# Patient Record
Sex: Female | Born: 1985 | Race: Black or African American | Hispanic: No | Marital: Married | Smoking: Never smoker
Health system: Southern US, Community
[De-identification: ages and names within clinical notes are randomized; demographics above are authoritative.]

## PROBLEM LIST (undated history)

## (undated) ENCOUNTER — Emergency Department (HOSPITAL_COMMUNITY): Discharge status: Against Medical Advice | Payer: Managed Care, Other (non HMO)

## (undated) ENCOUNTER — Inpatient Hospital Stay (HOSPITAL_COMMUNITY): Payer: Self-pay

## (undated) DIAGNOSIS — R87619 Unspecified abnormal cytological findings in specimens from cervix uteri: Secondary | ICD-10-CM

## (undated) DIAGNOSIS — O364XX0 Maternal care for intrauterine death, not applicable or unspecified: Secondary | ICD-10-CM

## (undated) HISTORY — PX: OTHER SURGICAL HISTORY: SHX169

## (undated) HISTORY — DX: Unspecified abnormal cytological findings in specimens from cervix uteri: R87.619

---

## 2010-05-16 ENCOUNTER — Other Ambulatory Visit: Payer: Self-pay

## 2010-05-16 DIAGNOSIS — Z3689 Encounter for other specified antenatal screening: Secondary | ICD-10-CM

## 2010-05-19 ENCOUNTER — Ambulatory Visit (HOSPITAL_COMMUNITY)
Admission: RE | Admit: 2010-05-19 | Discharge: 2010-05-19 | Disposition: A | Discharge status: Home / Self Care | Payer: Self-pay | Source: Ambulatory Visit | Attending: Family Medicine | Admitting: Family Medicine

## 2010-05-19 ENCOUNTER — Encounter (HOSPITAL_COMMUNITY): Payer: Self-pay

## 2010-05-19 DIAGNOSIS — O0930 Supervision of pregnancy with insufficient antenatal care, unspecified trimester: Secondary | ICD-10-CM | POA: Insufficient documentation

## 2010-05-19 DIAGNOSIS — Z3689 Encounter for other specified antenatal screening: Secondary | ICD-10-CM | POA: Insufficient documentation

## 2010-06-19 ENCOUNTER — Inpatient Hospital Stay (HOSPITAL_COMMUNITY): Admit: 2010-06-19 | Payer: Self-pay | Admitting: Obstetrics & Gynecology

## 2011-01-05 ENCOUNTER — Encounter: Payer: Self-pay | Admitting: Obstetrics & Gynecology

## 2011-01-05 ENCOUNTER — Ambulatory Visit (INDEPENDENT_AMBULATORY_CARE_PROVIDER_SITE_OTHER): Payer: Managed Care, Other (non HMO) | Admitting: Obstetrics & Gynecology

## 2011-01-05 ENCOUNTER — Other Ambulatory Visit (HOSPITAL_COMMUNITY)
Admission: RE | Admit: 2011-01-05 | Discharge: 2011-01-05 | Disposition: A | Discharge status: Home / Self Care | Payer: Managed Care, Other (non HMO) | Source: Ambulatory Visit | Attending: Obstetrics & Gynecology | Admitting: Obstetrics & Gynecology

## 2011-01-05 VITALS — BP 111/65 | HR 75 | Temp 98.1°F | Ht 65.0 in | Wt 136.1 lb

## 2011-01-05 DIAGNOSIS — R87629 Unspecified abnormal cytological findings in specimens from vagina: Secondary | ICD-10-CM

## 2011-01-05 DIAGNOSIS — IMO0002 Reserved for concepts with insufficient information to code with codable children: Secondary | ICD-10-CM

## 2011-01-05 DIAGNOSIS — R87619 Unspecified abnormal cytological findings in specimens from cervix uteri: Secondary | ICD-10-CM | POA: Insufficient documentation

## 2011-01-05 NOTE — Progress Notes (Signed)
  Subjective:    Patient ID: Abigail [name], female    DOB: 1985-09-15, 25 y.o.   MRN: [id number]  HPI  Ms. 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  Cervical cultures were negative.  Review of Systems     Objective:   Physical Exam colpo done, ECC obtained. Colpo reveals normal cervix.       Assessment & Plan:  H/o ASCUS pap with HR HPV I repeated her pap today and did a colpo.  Unless her ECC is positive, she should get another pap in 6 months.

## 2012-03-20 NOTE — L&D Delivery Note (Signed)
Delivery Note At 9:05 AM a viable female was delivered via Vaginal, Spontaneous Delivery (Presentation: LOA).  APGAR: 9, 9; weight 7 lb 0.2 oz (3180 g).   Placenta status: Intact, Spontaneous.  Cord: 3 vessels with the following complications: None.  Anesthesia: None  Episiotomy: None Lacerations: None Suture Repair: n/a Est. Blood Loss (mL): 150  Mom to postpartum.  Baby to nursery-stable. Placenta to L&D  Was called because patient was directly admitted from MAU and required IV pain medication. Received a second call before order was placed because cervix was dilated to 10cm with membranes intact. Membranes were artificially ruptured with light meconium stained fluid evacuated. Delivery was uncomplicated and baby delivered with vigorous cry and placed on mother for skin to skin. Placenta was delivered with no complications. Mild perineal abrasion on inspection; hemostatic.  Jacquelin Hawking 12/05/2012, 11:17 AM  I was present for and assisted with the delivery of this newborn.  I supervised Dr. Mal Misty throughout the delivery and agree with the above documentation.   Rulon Abide, M.D. [facility] Fellow 12/05/2012 11:48 AM

## 2012-06-01 LAB — OB RESULTS CONSOLE GC/CHLAMYDIA
Chlamydia: NEGATIVE
Gonorrhea: NEGATIVE

## 2012-06-01 LAB — OB RESULTS CONSOLE RUBELLA ANTIBODY, IGM: Rubella: IMMUNE

## 2012-06-01 LAB — OB RESULTS CONSOLE ANTIBODY SCREEN: Antibody Screen: NEGATIVE

## 2012-06-01 LAB — OB RESULTS CONSOLE GBS: GBS: NEGATIVE

## 2012-10-04 ENCOUNTER — Inpatient Hospital Stay (HOSPITAL_COMMUNITY)
Admission: AD | Admit: 2012-10-04 | Discharge: 2012-10-04 | Disposition: A | Discharge status: Home / Self Care | Payer: Medicaid Other | Source: Ambulatory Visit | Attending: Obstetrics & Gynecology | Admitting: Obstetrics & Gynecology

## 2012-10-04 ENCOUNTER — Other Ambulatory Visit: Payer: Self-pay | Admitting: Advanced Practice Midwife

## 2012-10-04 ENCOUNTER — Inpatient Hospital Stay (HOSPITAL_COMMUNITY): Payer: Medicaid Other

## 2012-10-04 ENCOUNTER — Encounter (HOSPITAL_COMMUNITY): Payer: Self-pay | Admitting: *Deleted

## 2012-10-04 DIAGNOSIS — O4700 False labor before 37 completed weeks of gestation, unspecified trimester: Secondary | ICD-10-CM | POA: Insufficient documentation

## 2012-10-04 DIAGNOSIS — R109 Unspecified abdominal pain: Secondary | ICD-10-CM | POA: Insufficient documentation

## 2012-10-04 LAB — CBC
Hemoglobin: 11.3 g/dL — ABNORMAL LOW (ref 12.0–15.0)
MCH: 29.1 pg (ref 26.0–34.0)
MCV: 86.1 fL (ref 78.0–100.0)
Platelets: 232 10*3/uL (ref 150–400)
RBC: 3.88 MIL/uL (ref 3.87–5.11)
WBC: 13.7 10*3/uL — ABNORMAL HIGH (ref 4.0–10.5)

## 2012-10-04 LAB — URINALYSIS, ROUTINE W REFLEX MICROSCOPIC
Bilirubin Urine: NEGATIVE
Specific Gravity, Urine: 1.015 (ref 1.005–1.030)
pH: 7.5 (ref 5.0–8.0)

## 2012-10-04 LAB — FETAL FIBRONECTIN: Fetal Fibronectin: NEGATIVE

## 2012-10-04 LAB — URINE MICROSCOPIC-ADD ON

## 2012-10-04 MED ORDER — OXYCODONE-ACETAMINOPHEN 5-325 MG PO TABS: 1.0000 | ORAL_TABLET | Freq: Four times a day (QID) | ORAL | Status: DC | PRN

## 2012-10-04 MED ORDER — HYDROMORPHONE HCL PF 1 MG/ML IJ SOLN
1.0000 mg | INTRAMUSCULAR | Status: AC
  Administered 2012-10-04: 1 mg via INTRAVENOUS
  Filled 2012-10-04: qty 1

## 2012-10-04 MED ORDER — NIFEDIPINE 10 MG PO CAPS
10.0000 mg | ORAL_CAPSULE | ORAL | Status: DC | PRN
  Administered 2012-10-04: 10 mg via ORAL
  Filled 2012-10-04: qty 1

## 2012-10-04 MED ORDER — LACTATED RINGERS IV BOLUS (SEPSIS)
1000.0000 mL | Freq: Once | INTRAVENOUS | Status: AC
  Administered 2012-10-04: 1000 mL via INTRAVENOUS

## 2012-10-04 MED ORDER — FENTANYL CITRATE 0.05 MG/ML IJ SOLN
50.0000 ug | Freq: Once | INTRAMUSCULAR | Status: AC
  Administered 2012-10-04: 50 ug via INTRAVENOUS
  Filled 2012-10-04: qty 2

## 2012-10-04 NOTE — MAU Provider Note (Signed)
Chief Complaint:  Labor Eval    First Provider Initiated Contact with Patient 10/04/12 [phone]     HPI: Abigail [name] is a 27 y.o. G2P1010 at 30.6 weeks by LMP who presents to maternity admissions reporting possible contractions w/ pain in her left mid- back radiating downward. Prenatal care in [place], but moved to [place] three weeks ago. Pos FM.   Pregnancy Course: Uncomplicated per pt.    Past Medical History: Past Medical History  Diagnosis Date  . Abnormal Pap smear of cervix     Past obstetric history: OB History   Grav Para Term Preterm Abortions TAB SAB Ect Mult Living   3 1 1  1 1    1      # Outc Date GA Lbr Len/2nd Wgt Sex Del Anes PTL Lv   1 TAB 2006           2 TRM 3/12 [redacted]w[redacted]d   M SVD   Yes   Comments: born at [facility]   3 CUR               Past Surgical History: Past Surgical History  Procedure Laterality Date  . Colonoscopy      Family History: Family History  Problem Relation Age of Onset  . Diabetes Mother   . Hypertension Mother   . Diabetes Father   . Hypertension Father     Social History: History  Substance Use Topics  . Smoking status: Never Smoker   . Smokeless tobacco: Never Used  . Alcohol Use: No    Allergies: No Known Allergies  Meds:  Prescriptions prior to admission  Medication Sig Dispense Refill  . prenatal vitamin w/FE, FA (PRENATAL 1 + 1) 27-1 MG TABS Take 1 tablet by mouth daily.          ROS: Pos for left flank and low back pain, possible contractions. Neg for fever, chills, N/V/D/C, urinary complaints, GI complaints, LOF, VB  Physical Exam  Blood pressure 126/80, pulse 106, temperature 98.1 F (36.7 C), temperature source Oral, resp. rate 18. GENERAL: Well-developed, well-nourished female in severe distress. Unable to tolerate lying down. HEENT: normocephalic HEART: Tachycardic RESP: normal effort ABDOMEN: Soft, non-tender, gravid appropriate for gestational age. BACK: Pos left CVAT. EXTREMITIES: Nontender, no  edema NEURO: alert and oriented PELVIC EXAM: NEFG, physiologic discharge, no blood, cervix clean Cervix 0/50/-2, mid-position, firm, vtx  FHT:  Baseline 140 , moderate variability, accelerations present, no decelerations Contractions: irritability q 4-6 mins, mild   Labs: Results for orders placed during the hospital encounter of 10/04/12 (from the past 24 hour(s))  CBC     Status: Abnormal   Collection Time    10/04/12  7:45 AM      Result Value Range   WBC 13.7 (*) 4.0 - 10.5 K/uL   RBC 3.88  3.87 - 5.11 MIL/uL   Hemoglobin 11.3 (*) 12.0 - 15.0 g/dL   HCT 96.0 (*) 45.4 - 09.8 %   MCV 86.1  78.0 - 100.0 fL   MCH 29.1  26.0 - 34.0 pg   MCHC 33.8  30.0 - 36.0 g/dL   RDW 11.9  14.7 - 82.9 %   Platelets 232  150 - 400 K/uL    MAU Course: UA, Renal US, Fentanyl, CB, fFN, IV bolus, Procardia ordered. Records requested.  Care of pt turned over to Joseph Berkshire, [place] at 0800.  [place], [place] 10/04/2012 8:01 AM  Imaging:   US Transvaginal Non-ob  10/04/2012   *RADIOLOGY  REPORT*  Clinical Data: Left flank pain.  30 weeks estimated gestational age.  Hematuria  RENAL/URINARY TRACT ULTRASOUND COMPLETE  Comparison:  None.  Findings:  Right Kidney:  Demonstrates a sagittal length of 10.7 cm.  No focal parenchymal abnormalities or signs of hydronephrosis are evident  Left Kidney:  Demonstrates a sagittal length of 11.5 cm.  There is mild dilatation of the renal pelvis and calyceal system noted. Associated dilatation of the proximal aspect of the ureter was seen.  At real time exam this appeared to taper in the region of the mid abdomen.  No focal parenchymal abnormalities or signs of renal calculi are noted.  Bladder:  Particulate matter was noted within the bladder lumen. No evidence for distal ureteral dilatation or distal renal stone was seen with endovaginal evaluation.  Both ureteral jets were noted.  IMPRESSION: Mild left renal pyelectasis with associated mild proximal ureteral  dilatation.  No evidence for a renal or ureteral calculus was seen.  Echoes within the bladder lumen would correlate with proteinaceous material within the urine, likely related to intraluminal blood given the provided history of hematuria   Original Report Authenticated By: Rhodia Albright, M.D.   US Renal  10/04/2012   *RADIOLOGY REPORT*  Clinical Data: Left flank pain.  30 weeks estimated gestational age.  Hematuria  RENAL/URINARY TRACT ULTRASOUND COMPLETE  Comparison:  None.  Findings:  Right Kidney:  Demonstrates a sagittal length of 10.7 cm.  No focal parenchymal abnormalities or signs of hydronephrosis are evident  Left Kidney:  Demonstrates a sagittal length of 11.5 cm.  There is mild dilatation of the renal pelvis and calyceal system noted. Associated dilatation of the proximal aspect of the ureter was seen.  At real time exam this appeared to taper in the region of the mid abdomen.  No focal parenchymal abnormalities or signs of renal calculi are noted.  Bladder:  Particulate matter was noted within the bladder lumen. No evidence for distal ureteral dilatation or distal renal stone was seen with endovaginal evaluation.  Both ureteral jets were noted.  IMPRESSION: Mild left renal pyelectasis with associated mild proximal ureteral dilatation.  No evidence for a renal or ureteral calculus was seen.  Echoes within the bladder lumen would correlate with proteinaceous material within the urine, likely related to intraluminal blood given the provided history of hematuria   Original Report Authenticated By: Rhodia Albright, M.D.   Dilaudid 1 mg IV x1 dose in MAU Per Dr Debroah Loop, D/C home with PTL precautions Percocet 5/325 x30 tabs Strain urine F/u with prenatal provider Return to MAU as needed  Sharen Counter Certified Nurse-Midwife

## 2012-10-04 NOTE — Progress Notes (Signed)
Pt returned to MAU reporting she got to pharmacy and did not have her prescription.  Called pt pharmacy and no prescription filled today.  Unable to reprint Percocet prescription despite several attempts so Percocet 5/325 x30 tabs written for pt.

## 2012-10-04 NOTE — Progress Notes (Signed)
Pt reports she got to pharmacy and did not have paper prescription with her.  Called pt listed pharmacy and no medication was filled today.  New prescription printed for Percocet 30 tabs.

## 2012-10-04 NOTE — MAU Note (Signed)
Pt presents with complaints of contractions that started around 455 this am. Denies any leakage of fluid or bleeding. States baby is active.

## 2012-10-07 NOTE — MAU Provider Note (Signed)
Agree with note. 

## 2012-12-05 ENCOUNTER — Inpatient Hospital Stay (HOSPITAL_COMMUNITY): Admit: 2012-12-05 | Payer: Self-pay | Admitting: Obstetrics & Gynecology

## 2012-12-05 ENCOUNTER — Inpatient Hospital Stay (HOSPITAL_COMMUNITY)
Admission: AD | Admit: 2012-12-05 | Discharge: 2012-12-06 | DRG: 775 | Disposition: A | Discharge status: Home / Self Care | Payer: Medicaid Other | Source: Ambulatory Visit | Attending: Obstetrics & Gynecology | Admitting: Obstetrics & Gynecology

## 2012-12-05 ENCOUNTER — Encounter (HOSPITAL_COMMUNITY): Payer: Self-pay

## 2012-12-05 LAB — CBC
MCV: 85.7 fL (ref 78.0–100.0)
Platelets: 282 10*3/uL (ref 150–400)
RBC: 3.99 MIL/uL (ref 3.87–5.11)
WBC: 11.9 10*3/uL — ABNORMAL HIGH (ref 4.0–10.5)

## 2012-12-05 LAB — ABO/RH: ABO/RH(D): O POS

## 2012-12-05 LAB — RPR: RPR Ser Ql: NONREACTIVE

## 2012-12-05 MED ORDER — PRENATAL MULTIVITAMIN CH
1.0000 | ORAL_TABLET | Freq: Every day | ORAL | Status: DC
  Administered 2012-12-05: 1 via ORAL
  Filled 2012-12-05: qty 1

## 2012-12-05 MED ORDER — IBUPROFEN 600 MG PO TABS
600.0000 mg | ORAL_TABLET | Freq: Four times a day (QID) | ORAL | Status: DC | PRN
  Administered 2012-12-05: 600 mg via ORAL
  Filled 2012-12-05: qty 1

## 2012-12-05 MED ORDER — OXYTOCIN BOLUS FROM INFUSION
500.0000 mL | INTRAVENOUS | Status: DC
  Administered 2012-12-05: 500 mL via INTRAVENOUS

## 2012-12-05 MED ORDER — ONDANSETRON HCL 4 MG/2ML IJ SOLN: 4.0000 mg | Freq: Four times a day (QID) | INTRAMUSCULAR | Status: DC | PRN

## 2012-12-05 MED ORDER — DIBUCAINE 1 % RE OINT
1.0000 "application " | TOPICAL_OINTMENT | RECTAL | Status: DC | PRN
  Administered 2012-12-05: 1 via RECTAL
  Filled 2012-12-05: qty 28

## 2012-12-05 MED ORDER — SIMETHICONE 80 MG PO CHEW: 80.0000 mg | CHEWABLE_TABLET | ORAL | Status: DC | PRN

## 2012-12-05 MED ORDER — CITRIC ACID-SODIUM CITRATE 334-500 MG/5ML PO SOLN: 30.0000 mL | ORAL | Status: DC | PRN

## 2012-12-05 MED ORDER — LACTATED RINGERS IV SOLN: 500.0000 mL | INTRAVENOUS | Status: DC | PRN

## 2012-12-05 MED ORDER — LACTATED RINGERS IV SOLN
INTRAVENOUS | Status: DC
  Administered 2012-12-05: 09:00:00 via INTRAVENOUS

## 2012-12-05 MED ORDER — DIPHENHYDRAMINE HCL 25 MG PO CAPS: 25.0000 mg | ORAL_CAPSULE | Freq: Four times a day (QID) | ORAL | Status: DC | PRN

## 2012-12-05 MED ORDER — WITCH HAZEL-GLYCERIN EX PADS
1.0000 "application " | MEDICATED_PAD | CUTANEOUS | Status: DC | PRN
  Administered 2012-12-05: 1 via TOPICAL

## 2012-12-05 MED ORDER — OXYCODONE-ACETAMINOPHEN 5-325 MG PO TABS
1.0000 | ORAL_TABLET | ORAL | Status: DC | PRN
  Administered 2012-12-05: 1 via ORAL
  Filled 2012-12-05: qty 1

## 2012-12-05 MED ORDER — TETANUS-DIPHTH-ACELL PERTUSSIS 5-2.5-18.5 LF-MCG/0.5 IM SUSP
0.5000 mL | Freq: Once | INTRAMUSCULAR | Status: AC
  Administered 2012-12-06: 0.5 mL via INTRAMUSCULAR
  Filled 2012-12-05: qty 0.5

## 2012-12-05 MED ORDER — ONDANSETRON HCL 4 MG/2ML IJ SOLN: 4.0000 mg | INTRAMUSCULAR | Status: DC | PRN

## 2012-12-05 MED ORDER — OXYCODONE-ACETAMINOPHEN 5-325 MG PO TABS: 1.0000 | ORAL_TABLET | ORAL | Status: DC | PRN

## 2012-12-05 MED ORDER — OXYTOCIN 40 UNITS IN LACTATED RINGERS INFUSION - SIMPLE MED
62.5000 mL/h | INTRAVENOUS | Status: DC
  Administered 2012-12-05: 62.5 mL/h via INTRAVENOUS
  Filled 2012-12-05: qty 1000

## 2012-12-05 MED ORDER — ONDANSETRON HCL 4 MG PO TABS: 4.0000 mg | ORAL_TABLET | ORAL | Status: DC | PRN

## 2012-12-05 MED ORDER — LIDOCAINE HCL (PF) 1 % IJ SOLN
30.0000 mL | INTRAMUSCULAR | Status: DC | PRN
  Filled 2012-12-05 (×2): qty 30

## 2012-12-05 MED ORDER — SENNOSIDES-DOCUSATE SODIUM 8.6-50 MG PO TABS
2.0000 | ORAL_TABLET | ORAL | Status: DC
  Administered 2012-12-06: 2 via ORAL

## 2012-12-05 MED ORDER — ZOLPIDEM TARTRATE 5 MG PO TABS: 5.0000 mg | ORAL_TABLET | Freq: Every evening | ORAL | Status: DC | PRN

## 2012-12-05 MED ORDER — BENZOCAINE-MENTHOL 20-0.5 % EX AERO
1.0000 "application " | INHALATION_SPRAY | CUTANEOUS | Status: DC | PRN
  Administered 2012-12-05: 1 via TOPICAL
  Filled 2012-12-05: qty 56

## 2012-12-05 MED ORDER — LANOLIN HYDROUS EX OINT: TOPICAL_OINTMENT | CUTANEOUS | Status: DC | PRN

## 2012-12-05 MED ORDER — ACETAMINOPHEN 325 MG PO TABS: 650.0000 mg | ORAL_TABLET | ORAL | Status: DC | PRN

## 2012-12-05 MED ORDER — IBUPROFEN 600 MG PO TABS
600.0000 mg | ORAL_TABLET | Freq: Four times a day (QID) | ORAL | Status: DC
  Administered 2012-12-05 – 2012-12-06 (×3): 600 mg via ORAL
  Filled 2012-12-05 (×3): qty 1

## 2012-12-05 NOTE — MAU Note (Signed)
Contractions since 0330. No bleeding or leaking, 2nd preg. Was 4 cm when last checked. No problems with preg.

## 2012-12-05 NOTE — Lactation Note (Signed)
This note was copied from the chart of Abigail [name]. Lactation Consultation Note  Patient Name: Abigail Fujiko Picazo Today's Date: 12/05/2012 Reason for consult: Initial assessment of this second-time mother and her new baby at 8 hours of age.  Baby is asleep STS and not showing any hunger cues.  RN, Misty Stanley had reported that mom is experiencing some nipple tenderness on (R) breast similar to experience with her first baby.  Mom states baby latches easily on (L).  Baby had fed an hour ago.  Per mom, ahe stopped breastfeeding with her first baby when nipple soreness persisted after one month and she never used comfort gelpads so LC offered and reviewed their use and discussed plan with mom and her nurse.  RN to assist with latch at next feeding and mom to try starting on easier (L), then switch to (R).  LC encouraged continuing STS and cue feedings tonight and  LC provided [facility] Resource brochure and reviewed [facility] services and list of community and web site resources.    Maternal Data Formula Feeding for Exclusion: No Infant to breast within first hour of birth: No Breastfeeding delayed due to:: Other (comment) (not documented) Has patient been taught Hand Expression?: Yes Does the patient have breastfeeding experience prior to this delivery?: Yes  Feeding Feeding Type: Breast Milk  LATCH Score/Interventions Latch: Repeated attempts needed to sustain latch, nipple held in mouth throughout feeding, stimulation needed to elicit sucking reflex. Intervention(s): Assist with latch  Audible Swallowing: A few with stimulation Intervention(s): Skin to skin  Type of Nipple: Everted at rest and after stimulation  Comfort (Breast/Nipple): Filling, red/small blisters or bruises, mild/mod discomfort  Problem noted: Mild/Moderate discomfort (right side, per mom)  Hold (Positioning): Assistance needed to correctly position infant at breast and maintain latch. Intervention(s): Breastfeeding basics  reviewed  LATCH Score: 7  (previous feeding assessment by RN)  Lactation Tools Discussed/Used Tools: Comfort gels STS, cue feedings Switch nursing from easier to more difficult breast  Consult Status Consult Status: Follow-up Date: 12/06/12 Follow-up type: In-patient    Warrick Parisian [facility] 12/05/2012, 9:01 PM

## 2012-12-05 NOTE — H&P (Signed)
Abigail [name] is a 27 y.o. female presenting for contractions since 0330 this morning. She reports painful contractions without LOF or VB.  +FM.   Prenatal care was obtained in [facility] and records are being obtained. Per the pt her [facility] was uncomplicated and she denies having GBS.    History OB History   Grav Para Term Preterm Abortions TAB SAB Ect Mult Living   3 1 1  1 1    1      Past Medical History  Diagnosis Date  . Abnormal Pap smear of cervix    Past Surgical History  Procedure Laterality Date  . Colonoscopy     Family History: family history includes Diabetes in her father and mother; Hypertension in her father and mother. Social History:  reports that she has never smoked. She has never used smokeless tobacco. She reports that she does not drink alcohol or use illicit drugs.   Prenatal Transfer Tool  Maternal Diabetes: No Genetic Screening: Unknown Maternal Ultrasounds/Referrals: Unknown Fetal Ultrasounds or other Referrals:  Unknown Maternal Substance Abuse:  No Significant Maternal Medications:  None Significant Maternal Lab Results:  Unknown Other Comments:  None  Review of Systems  Gastrointestinal: Positive for abdominal pain.       Painful contractions    Dilation: 10 Effacement (%): 100 Station: -1 Exam by:: christine, RN  Blood pressure 92/53, pulse 79, temperature 98.1 F (36.7 C), temperature source Oral, resp. rate 20, last menstrual period 03/02/2012.   Fetal Exam Fetal Monitor Review: Mode: ultrasound.   Baseline rate: 130.  Variability: moderate (6-25 bpm).   Pattern: no accelerations and no decelerations.    Fetal State Assessment: Category I - tracings are normal.     Physical Exam  Nursing note and vitals reviewed. Constitutional: She is oriented to person, place, and time. She appears well-developed and well-nourished. No distress.  Respiratory: No respiratory distress.  Genitourinary: Vagina normal.  Musculoskeletal: Normal  range of motion. She exhibits no edema and no tenderness.  Neurological: She is alert and oriented to person, place, and time.  Skin: Skin is warm and dry. She is not diaphoretic. No erythema.    Prenatal labs: ABO, Rh: O/Positive/-- (03/15 0000) Antibody: Negative (03/15 0000) Rubella: Immune (03/15 0000) RPR: Nonreactive (03/15 0000) HBsAg: Negative (03/15 0000) HIV: Non-reactive (03/15 0000) GBS: Negative (03/15 0000)  Assessment/Plan: 41LK G4W1027 at [redacted]w[redacted]d in active labor  #Active labor - she was directly admitted from MAU and was completely dilated by the time she got to the floor - patient already admitted to birthing suites - routine labor orders - wants pain medication, but already dilated to 10cm - expect NSVD  #Postpartum - Method of contraception: Mirena - Method of feeding: breastfeeding only - Circumcision: Girl; n/a   Jacquelin Hawking 12/05/2012, 9:47 AM

## 2012-12-06 LAB — CBC
HCT: 30.9 % — ABNORMAL LOW (ref 36.0–46.0)
Hemoglobin: 10.2 g/dL — ABNORMAL LOW (ref 12.0–15.0)
MCV: 86.3 fL (ref 78.0–100.0)
RBC: 3.58 MIL/uL — ABNORMAL LOW (ref 3.87–5.11)
WBC: 13.8 10*3/uL — ABNORMAL HIGH (ref 4.0–10.5)

## 2012-12-06 MED ORDER — PRENATAL MULTIVITAMIN CH: 1.0000 | ORAL_TABLET | Freq: Every day | ORAL | Status: DC

## 2012-12-06 MED ORDER — IBUPROFEN 600 MG PO TABS: 600.0000 mg | ORAL_TABLET | Freq: Four times a day (QID) | ORAL | Status: DC

## 2012-12-06 NOTE — Progress Notes (Signed)
Post Partum Day 1 Subjective: no complaints, up ad lib, voiding and tolerating PO  BF well.  Objective: Blood pressure 136/83, pulse 59, temperature 98 F (36.7 C), temperature source Oral, resp. rate 19, height 5\' 6"  (1.676 m), weight 70.761 kg (156 lb), last menstrual period 03/02/2012, SpO2 100.00%, unknown if currently breastfeeding.  Physical Exam:  General: alert, cooperative, appears stated age and no distress Lochia: appropriate Uterine Fundus: firm DVT Evaluation: No evidence of DVT seen on physical exam. Negative Homan's sign. No cords or calf tenderness. No significant calf/ankle edema.   Recent Labs  12/05/12 0840 12/06/12 0605  HGB 11.4* 10.2*  HCT 34.2* 30.9*    Assessment/Plan: Plan to dc home today.  BBPC: Mirena Routine PostPartum Care   LOS: 1 day   Marissa Calamity 12/06/2012, 7:30 AM

## 2012-12-06 NOTE — Progress Notes (Signed)
UR chart review completed.  

## 2012-12-06 NOTE — Lactation Note (Signed)
This note was copied from the chart of Abigail [name]. Lactation Consultation Note  Patient Name: Abigail Aquanetta Schwarz [id number] Date: 12/06/2012 Reason for consult: Follow-up assessment Per mom baby is breast feeding well on the left breast , right breast having discomfort  With latch. @ consult LC assessed breast tissue , noted the areolas to have some swelling at the base . After breast massage, hand express, and prepump the areola softened . Also noted the baby to have decrease  Mobility of the tongue ( short frenulum ) , per mom had noticed it also , , also commented her 1st baby had the same. @ consult placed baby skin to skin in football position and assisted mom with depth and her mom felt more comfortable. Baby fed in a consistent pattern with multiply swallows and increased with breast compressions.for 8 mins, and became  Non- nutritive so showed mom how to re-lease suction .  Mom aware of the BFSG and the Lc O/P services at [facility]. T/S Pedis aware of the short frenulum.    Maternal Data Has patient been taught Hand Expression?: Yes  Feeding Feeding Type: Breast Milk Length of feed: 8 min (consisitent pattern with multiply swallows , inc compression)  LATCH Score/Interventions Latch: Grasps breast easily, tongue down, lips flanged, rhythmical sucking. (right breast , footabll ) Intervention(s): Adjust position;Assist with latch;Breast massage;Breast compression  Audible Swallowing: Spontaneous and intermittent  Type of Nipple: Everted at rest and after stimulation (some swelling areola )  Comfort (Breast/Nipple): Filling, red/small blisters or bruises, mild/mod discomfort  Problem noted: Mild/Moderate discomfort (improved with depth , no breakdown )  Hold (Positioning): Assistance needed to correctly position infant at breast and maintain latch. (worked on depth ) Intervention(s): Breastfeeding basics reviewed;Support Pillows;Position options;Skin to skin  LATCH Score:  8  Lactation Tools Discussed/Used Tools: Shells;Pump;Comfort gels Shell Type: Inverted Breast pump type: Manual WIC Program: Yes Pump Review: Milk Storage;Setup, frequency, and cleaning Initiated by:: MAI  Date initiated:: 12/06/12   Consult Status      Kathrin Greathouse 12/06/2012, 10:11 AM

## 2012-12-06 NOTE — Discharge Summary (Signed)
Obstetric Discharge Summary Reason for Admission: onset of labor Prenatal Procedures: none Intrapartum Procedures: spontaneous vaginal delivery Postpartum Procedures: none Complications-Operative and Postpartum: none Hemoglobin  Date Value Range Status  12/06/2012 10.2* 12.0 - 15.0 g/dL Final     HCT  Date Value Range Status  12/06/2012 30.9* 36.0 - 46.0 % Final    Physical Exam:  General: alert, cooperative, appears stated age and no distress Lochia: appropriate Uterine Fundus: firm DVT Evaluation: No evidence of DVT seen on physical exam. Negative Homan's sign. No cords or calf tenderness. No significant calf/ankle edema.  Discharge Diagnoses: Term Pregnancy-delivered  Discharge Information: Date: 12/06/2012 Activity: unrestricted Diet: routine Medications: PNV and Ibuprofen Condition: stable Instructions: refer to practice specific booklet Discharge to: home Follow-up Information   Follow up with [facility]. Schedule an appointment as soon as possible for a visit in 4 weeks.   Specialty:  Obstetrics and Gynecology   Contact information:   [address] [place] [address] [phone]    MOC: Mirena MOF Breast  Newborn Data: Live born female  Birth Weight: 7 lb 0.2 oz (3180 g) APGAR: 9, 9  Home with mother.  Tawana Scale 12/06/2012, 8:57 AM  I spoke with and examined patient and agree with resident's note and plan of care.  Tawana Scale, MD OB Fellow 12/06/2012 8:57 AM

## 2012-12-08 ENCOUNTER — Inpatient Hospital Stay (HOSPITAL_COMMUNITY)
Admission: AD | Admit: 2012-12-08 | Discharge: 2012-12-08 | Disposition: A | Discharge status: Home / Self Care | Payer: Medicaid Other | Source: Ambulatory Visit | Attending: Obstetrics & Gynecology | Admitting: Obstetrics & Gynecology

## 2012-12-08 ENCOUNTER — Encounter (HOSPITAL_COMMUNITY): Payer: Self-pay | Admitting: *Deleted

## 2012-12-08 DIAGNOSIS — O864 Pyrexia of unknown origin following delivery: Secondary | ICD-10-CM | POA: Insufficient documentation

## 2012-12-08 DIAGNOSIS — R51 Headache: Secondary | ICD-10-CM | POA: Insufficient documentation

## 2012-12-08 LAB — URINE MICROSCOPIC-ADD ON

## 2012-12-08 LAB — URINALYSIS, ROUTINE W REFLEX MICROSCOPIC
Bilirubin Urine: NEGATIVE
Glucose, UA: NEGATIVE mg/dL
Specific Gravity, Urine: >1.03 — ABNORMAL HIGH (ref 1.005–1.030)
pH: 6 (ref 5.0–8.0)

## 2012-12-08 MED ORDER — DICLOXACILLIN SODIUM 500 MG PO CAPS: 500.0000 mg | ORAL_CAPSULE | Freq: Four times a day (QID) | ORAL | Status: DC

## 2012-12-08 NOTE — MAU Note (Signed)
Pt c/o sore nipples, headache, and chills & fever of 102.4 at home.  Reports taking ibuprofen prior to coming.

## 2012-12-08 NOTE — MAU Provider Note (Signed)
History     CSN: [id number]  Arrival date and time: 12/08/12 1732   First Provider Initiated Contact with Patient 12/08/12 1759      Chief Complaint  Patient presents with  . Fever  . Vaginal Bleeding   Fever   Vaginal Bleeding Associated symptoms include a fever.   Abigail [name] is a 27 y.o. [id number] at [redacted]w[redacted]d now PPD # 3 presents with complaints of fevers and chills starting this morning. Patient states that she had temperature 100.2 this morning that resolved. Throughout the day patient's felt under the weather then start having chills again this evening. Repeat temperature at home was 100.4. Patient took Motrin and came to the emergency room. Patient states that she had a headaches with the fevers and chills but otherwise had no complaints.    Patient denies any shortness of breath, cough, nausea, vomiting, diarrhea, constipation, pain with urination, increased frequency, swelling or insurance, increase or change in discharge. Patient reports that she does have persistent vaginal bleeding approximately as a light period. Patient has been breast-feeding and states that she does have pain with initiation of the right breast for the first 30 seconds and then resolves. Patient states she does not have any rashes on her body or other concerning findings.   OB History   Grav Para Term Preterm Abortions TAB SAB Ect Mult Living   3 2 2  1 1    2       Past Medical History  Diagnosis Date  . Abnormal Pap smear of cervix     Past Surgical History  Procedure Laterality Date  . Colonoscopy      Family History  Problem Relation Age of Onset  . Diabetes Mother   . Hypertension Mother   . Diabetes Father   . Hypertension Father     History  Substance Use Topics  . Smoking status: Never Smoker   . Smokeless tobacco: Never Used  . Alcohol Use: No    Allergies: No Known Allergies  Prescriptions prior to admission  Medication Sig Dispense Refill  . ibuprofen (ADVIL,MOTRIN)  600 MG tablet Take 1 tablet (600 mg total) by mouth every 6 (six) hours.  30 tablet  0  . Prenatal Vit-Fe Fumarate-FA (PRENATAL MULTIVITAMIN) TABS tablet Take 1 tablet by mouth daily at 12 noon.  30 tablet  3    Review of Systems  Constitutional: Positive for fever.  Genitourinary: Positive for vaginal bleeding.    See above Physical Exam   Blood pressure 135/83, pulse 94, temperature 99.5 F (37.5 C), temperature source Oral, resp. rate 16, last menstrual period 03/02/2012, unknown if currently breastfeeding.  Physical Exam  Constitutional: She is oriented to person, place, and time. She appears well-developed and well-nourished. No distress.  HENT:  Head: Normocephalic and atraumatic.  Eyes: Conjunctivae and EOM are normal. Pupils are equal, round, and reactive to light. Right eye exhibits no discharge. Left eye exhibits no discharge.  Neck: Normal range of motion. Neck supple.  Cardiovascular: Normal rate, regular rhythm, normal heart sounds and intact distal pulses.  Exam reveals no gallop and no friction rub.   No murmur heard. Respiratory: Effort normal and breath sounds normal. No respiratory distress. She has no wheezes. She has no rales. She exhibits no mass, no tenderness and no edema. Right breast exhibits mass (Palpable milk ducts) and tenderness (mild tenderness of milk duct). Right breast exhibits no inverted nipple and no skin change. Nipple discharge: Lactation. Left breast exhibits no inverted nipple, no  mass, no nipple discharge, no skin change and no tenderness. Breasts are symmetrical.  GI: Soft. Bowel sounds are normal. She exhibits no distension and no mass. There is no tenderness ( no uterine tenderness). There is no rebound and no guarding.  Musculoskeletal: Normal range of motion. She exhibits no edema and no tenderness (equal calves bilaterally, no edema).  Lymphadenopathy:    She has no cervical adenopathy.  Neurological: She is alert and oriented to person,  place, and time.  Skin: Skin is warm and dry. No rash noted. She is not diaphoretic. No erythema. No pallor.  Psychiatric: She has a normal mood and affect. Her behavior is normal. Judgment and thought content normal.   Vagina: expected dark red lochia, No CMT. No lesions or purulent discharge.   Results for EARLA, CHARLIE (MRN [id number]) as of 12/08/2012 18:16  Ref. Range 12/08/2012 17:45  Color, Urine Latest Range: YELLOW  YELLOW  APPearance Latest Range: CLEAR  CLEAR  Specific Gravity, Urine Latest Range: 1.005-1.030  >1.030 (H)  pH Latest Range: 5.0-8.0  6.0  Glucose Latest Range: NEGATIVE mg/dL NEGATIVE  Bilirubin Urine Latest Range: NEGATIVE  NEGATIVE  Ketones, ur Latest Range: NEGATIVE mg/dL 15 (A)  Protein Latest Range: NEGATIVE mg/dL 30 (A)  Urobilinogen, UA Latest Range: 0.0-1.0 mg/dL 0.2  Nitrite Latest Range: NEGATIVE  NEGATIVE  Leukocytes, UA Latest Range: NEGATIVE  TRACE (A)  Hgb urine dipstick Latest Range: NEGATIVE  MODERATE (A)  Urine-Other No range found MUCOUS PRESENT  WBC, UA Latest Range: <3 WBC/hpf 3-6  RBC / HPF Latest Range: <3 RBC/hpf 3-6  Squamous Epithelial / LPF Latest Range: RARE  FEW (A)   MAU Course  Procedures  MDM No obvious etiology of fevers. Suspect engorgement vs early mastitis on right.  Assessment and Plan  [name] is a 27 y.o. G3P2012 at [redacted]w[redacted]d PPD#3 with PP Fever attributed to engorgement or mastitis early. Discussed with pt that no obvious source. Recommended pt go home, and if fever returns, start ABx for tx of mastitis as this was the only possible source on physical exam. Pt stated understanding and will pick up Rx and start if needed.  Tawana Scale 12/08/2012, 6:10 PM

## 2012-12-08 NOTE — MAU Note (Signed)
Pt presents with complaints of an increase in vaginal bleeding and she has had a fever today. She did take motrin earlier today. Pt delivered September the 18th vaginal delivery.

## 2012-12-08 NOTE — MAU Note (Signed)
Breasts filling, no redness noted. sm nodule on right breast tha tshe does not c/o about

## 2012-12-12 ENCOUNTER — Ambulatory Visit (INDEPENDENT_AMBULATORY_CARE_PROVIDER_SITE_OTHER): Payer: Medicaid Other | Admitting: Obstetrics and Gynecology

## 2012-12-12 VITALS — BP 126/87 | HR 89 | Temp 98.2°F | Ht 66.0 in | Wt 140.5 lb

## 2012-12-12 DIAGNOSIS — N61 Mastitis without abscess: Secondary | ICD-10-CM

## 2012-12-12 NOTE — Progress Notes (Signed)
Patient here for reevaluation. States feels better- is taking dicloxacillin. But states feels like her milk supply has decreased.
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
## 2013-01-20 ENCOUNTER — Telehealth: Payer: Self-pay | Admitting: *Deleted

## 2013-01-20 ENCOUNTER — Ambulatory Visit (INDEPENDENT_AMBULATORY_CARE_PROVIDER_SITE_OTHER): Payer: Medicaid Other | Admitting: Obstetrics and Gynecology

## 2013-01-20 ENCOUNTER — Encounter: Payer: Self-pay | Admitting: Obstetrics and Gynecology

## 2013-01-20 VITALS — BP 119/76 | HR 107 | Temp 97.8°F | Ht 66.0 in | Wt 134.8 lb

## 2013-01-20 DIAGNOSIS — Z30430 Encounter for insertion of intrauterine contraceptive device: Secondary | ICD-10-CM

## 2013-01-20 LAB — POCT PREGNANCY, URINE: Preg Test, Ur: NEGATIVE

## 2013-01-20 MED ORDER — LEVONORGESTREL 20 MCG/24HR IU IUD
INTRAUTERINE_SYSTEM | Freq: Once | INTRAUTERINE | Status: AC
  Administered 2013-01-20: 15:00:00 via INTRAUTERINE

## 2013-01-20 NOTE — Addendum Note (Signed)
Addended by: Gerome Apley on: 01/20/2013 03:11 PM   Modules accepted: Orders
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
## 2013-01-20 NOTE — Progress Notes (Signed)
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

## 2013-01-23 NOTE — Telephone Encounter (Signed)
Opened in error

## 2013-02-17 ENCOUNTER — Encounter: Payer: Self-pay | Admitting: *Deleted

## 2013-10-17 ENCOUNTER — Ambulatory Visit
Admission: RE | Admit: 2013-10-17 | Discharge: 2013-10-17 | Disposition: A | Discharge status: Home / Self Care | Payer: 59 | Source: Ambulatory Visit | Attending: Physician Assistant | Admitting: Physician Assistant

## 2013-10-17 ENCOUNTER — Other Ambulatory Visit: Payer: Self-pay | Admitting: Physician Assistant

## 2013-10-17 DIAGNOSIS — M25552 Pain in left hip: Secondary | ICD-10-CM

## 2014-01-19 ENCOUNTER — Encounter: Payer: Self-pay | Admitting: Obstetrics and Gynecology

## 2014-03-17 ENCOUNTER — Other Ambulatory Visit: Payer: Self-pay | Admitting: Family Medicine

## 2014-03-17 ENCOUNTER — Ambulatory Visit
Admission: RE | Admit: 2014-03-17 | Discharge: 2014-03-17 | Disposition: A | Discharge status: Home / Self Care | Payer: 59 | Source: Ambulatory Visit | Attending: Family Medicine | Admitting: Family Medicine

## 2014-03-17 DIAGNOSIS — M20012 Mallet finger of left finger(s): Secondary | ICD-10-CM

## 2014-12-24 ENCOUNTER — Emergency Department (HOSPITAL_COMMUNITY)
Admission: EM | Admit: 2014-12-24 | Discharge: 2014-12-24 | Disposition: A | Discharge status: Home / Self Care | Payer: 59 | Attending: Emergency Medicine | Admitting: Emergency Medicine

## 2014-12-24 ENCOUNTER — Encounter (HOSPITAL_COMMUNITY): Payer: Self-pay | Admitting: Neurology

## 2014-12-24 ENCOUNTER — Emergency Department (HOSPITAL_COMMUNITY): Payer: 59

## 2014-12-24 DIAGNOSIS — Y9389 Activity, other specified: Secondary | ICD-10-CM | POA: Insufficient documentation

## 2014-12-24 DIAGNOSIS — Z79899 Other long term (current) drug therapy: Secondary | ICD-10-CM | POA: Insufficient documentation

## 2014-12-24 DIAGNOSIS — Y998 Other external cause status: Secondary | ICD-10-CM | POA: Diagnosis not present

## 2014-12-24 DIAGNOSIS — S0990XA Unspecified injury of head, initial encounter: Secondary | ICD-10-CM | POA: Diagnosis present

## 2014-12-24 DIAGNOSIS — R413 Other amnesia: Secondary | ICD-10-CM | POA: Insufficient documentation

## 2014-12-24 DIAGNOSIS — R410 Disorientation, unspecified: Secondary | ICD-10-CM | POA: Insufficient documentation

## 2014-12-24 DIAGNOSIS — S0003XA Contusion of scalp, initial encounter: Secondary | ICD-10-CM | POA: Insufficient documentation

## 2014-12-24 DIAGNOSIS — Z792 Long term (current) use of antibiotics: Secondary | ICD-10-CM | POA: Diagnosis not present

## 2014-12-24 DIAGNOSIS — T148XXA Other injury of unspecified body region, initial encounter: Secondary | ICD-10-CM

## 2014-12-24 DIAGNOSIS — Z3202 Encounter for pregnancy test, result negative: Secondary | ICD-10-CM | POA: Diagnosis not present

## 2014-12-24 DIAGNOSIS — X58XXXA Exposure to other specified factors, initial encounter: Secondary | ICD-10-CM | POA: Diagnosis not present

## 2014-12-24 DIAGNOSIS — Z791 Long term (current) use of non-steroidal anti-inflammatories (NSAID): Secondary | ICD-10-CM | POA: Diagnosis not present

## 2014-12-24 DIAGNOSIS — Y9289 Other specified places as the place of occurrence of the external cause: Secondary | ICD-10-CM | POA: Diagnosis not present

## 2014-12-24 LAB — COMPREHENSIVE METABOLIC PANEL
ALK PHOS: 49 U/L (ref 38–126)
ALT: 26 U/L (ref 14–54)
AST: 19 U/L (ref 15–41)
Albumin: 4 g/dL (ref 3.5–5.0)
Anion gap: 6 (ref 5–15)
BILIRUBIN TOTAL: 0.6 mg/dL (ref 0.3–1.2)
BUN: 17 mg/dL (ref 6–20)
CALCIUM: 9.2 mg/dL (ref 8.9–10.3)
CO2: 26 mmol/L (ref 22–32)
CREATININE: 1.02 mg/dL — AB (ref 0.44–1.00)
Chloride: 106 mmol/L (ref 101–111)
Glucose, Bld: 101 mg/dL — ABNORMAL HIGH (ref 65–99)
Potassium: 3.7 mmol/L (ref 3.5–5.1)
Sodium: 138 mmol/L (ref 135–145)
Total Protein: 7.2 g/dL (ref 6.5–8.1)

## 2014-12-24 LAB — I-STAT BETA HCG BLOOD, ED (MC, WL, AP ONLY): I-stat hCG, quantitative: <5 m[IU]/mL (ref <5)

## 2014-12-24 LAB — CBC
HCT: 38.2 % (ref 36.0–46.0)
Hemoglobin: 12.4 g/dL (ref 12.0–15.0)
MCH: 28.2 pg (ref 26.0–34.0)
MCHC: 32.5 g/dL (ref 30.0–36.0)
MCV: 87 fL (ref 78.0–100.0)
PLATELETS: 290 10*3/uL (ref 150–400)
RBC: 4.39 MIL/uL (ref 3.87–5.11)
RDW: 12.3 % (ref 11.5–15.5)
WBC: 14.2 10*3/uL — AB (ref 4.0–10.5)

## 2014-12-24 LAB — CBG MONITORING, ED: GLUCOSE-CAPILLARY: 90 mg/dL (ref 65–99)

## 2014-12-24 MED ORDER — IBUPROFEN 400 MG PO TABS
600.0000 mg | ORAL_TABLET | Freq: Once | ORAL | Status: AC
  Administered 2014-12-24: 600 mg via ORAL
  Filled 2014-12-24 (×2): qty 1
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
## 2014-12-24 NOTE — Discharge Instructions (Signed)
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
## 2014-12-25 ENCOUNTER — Encounter (HOSPITAL_COMMUNITY): Payer: Self-pay | Admitting: Emergency Medicine

## 2014-12-25 ENCOUNTER — Emergency Department (HOSPITAL_COMMUNITY)
Admission: EM | Admit: 2014-12-25 | Discharge: 2014-12-25 | Disposition: A | Discharge status: Home / Self Care | Payer: 59 | Attending: Emergency Medicine | Admitting: Emergency Medicine

## 2014-12-25 ENCOUNTER — Inpatient Hospital Stay (HOSPITAL_COMMUNITY): Admit: 2014-12-25 | Payer: Self-pay

## 2014-12-25 DIAGNOSIS — S0003XA Contusion of scalp, initial encounter: Secondary | ICD-10-CM | POA: Diagnosis not present

## 2014-12-25 DIAGNOSIS — Y9289 Other specified places as the place of occurrence of the external cause: Secondary | ICD-10-CM | POA: Diagnosis not present

## 2014-12-25 DIAGNOSIS — Y998 Other external cause status: Secondary | ICD-10-CM | POA: Diagnosis not present

## 2014-12-25 DIAGNOSIS — S0990XA Unspecified injury of head, initial encounter: Secondary | ICD-10-CM | POA: Insufficient documentation

## 2014-12-25 DIAGNOSIS — Y9389 Activity, other specified: Secondary | ICD-10-CM | POA: Diagnosis not present

## 2014-12-25 DIAGNOSIS — Z0441 Encounter for examination and observation following alleged adult rape: Secondary | ICD-10-CM | POA: Insufficient documentation

## 2014-12-25 DIAGNOSIS — IMO0002 Reserved for concepts with insufficient information to code with codable children: Secondary | ICD-10-CM

## 2014-12-25 DIAGNOSIS — X58XXXA Exposure to other specified factors, initial encounter: Secondary | ICD-10-CM | POA: Diagnosis not present

## 2014-12-25 DIAGNOSIS — T148XXA Other injury of unspecified body region, initial encounter: Secondary | ICD-10-CM
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
## 2015-02-08 ENCOUNTER — Other Ambulatory Visit: Payer: Self-pay | Admitting: Family Medicine

## 2015-02-08 ENCOUNTER — Ambulatory Visit
Admission: RE | Admit: 2015-02-08 | Discharge: 2015-02-08 | Disposition: A | Discharge status: Home / Self Care | Payer: 59 | Source: Ambulatory Visit | Attending: Family Medicine | Admitting: Family Medicine

## 2015-02-08 DIAGNOSIS — S0003XA Contusion of scalp, initial encounter: Secondary | ICD-10-CM

## 2015-06-24 ENCOUNTER — Ambulatory Visit: Payer: 59 | Admitting: Family Medicine

## 2015-07-07 ENCOUNTER — Ambulatory Visit: Payer: 59 | Admitting: Obstetrics & Gynecology

## 2016-04-06 ENCOUNTER — Encounter: Payer: Self-pay | Admitting: General Practice

## 2016-04-06 ENCOUNTER — Ambulatory Visit: Payer: 59 | Admitting: Family

## 2016-04-18 ENCOUNTER — Ambulatory Visit: Payer: 59 | Admitting: Obstetrics and Gynecology

## 2016-05-08 ENCOUNTER — Ambulatory Visit: Payer: 59 | Admitting: Family Medicine

## 2016-06-16 ENCOUNTER — Emergency Department
Admission: EM | Admit: 2016-06-16 | Discharge: 2016-06-16 | Disposition: A | Discharge status: Home / Self Care | Payer: 59 | Attending: Emergency Medicine | Admitting: Emergency Medicine

## 2016-06-16 ENCOUNTER — Encounter: Payer: Self-pay | Admitting: Emergency Medicine

## 2016-06-16 DIAGNOSIS — T7840XA Allergy, unspecified, initial encounter: Secondary | ICD-10-CM | POA: Diagnosis present

## 2016-06-16 DIAGNOSIS — L2489 Irritant contact dermatitis due to other agents: Secondary | ICD-10-CM | POA: Diagnosis not present

## 2016-06-16 MED ORDER — MUPIROCIN 2 % EX OINT: 1.0000 "application " | TOPICAL_OINTMENT | Freq: Two times a day (BID) | CUTANEOUS | 0 refills | Status: DC

## 2016-06-16 MED ORDER — RANITIDINE HCL 150 MG PO TABS: 150.0000 mg | ORAL_TABLET | Freq: Two times a day (BID) | ORAL | 0 refills | Status: DC

## 2016-06-16 NOTE — ED Notes (Signed)
Small rash noted to lips. Patient states that she was seen at urgent care and given benadryl and steroids with resolution of symptoms, however it has returned.

## 2016-06-16 NOTE — ED Triage Notes (Signed)
States lips itchy and swollen x 2 week. States went to urgent care 2 weeks ago and got an injection and states it seemed to resolve but symptoms recurring this week. No resp distress.

## 2016-06-16 NOTE — ED Provider Notes (Signed)
[facility] Emergency Department Provider Note  ____________________________________________  Time seen: Approximately 10:50 AM  I have reviewed the triage vital signs and the nursing notes.   HISTORY  Chief Complaint Allergic Reaction    HPI Abigail [name] is a 31 y.o. female presents to the emergency department with lip irritation for 2 weeks. Patient states that she was seen in urgent care 2 weeks ago for similar symptoms. They told her that she was having an allergic reaction to her new beeswax chapstick. They gave her a shot of prednisone and Benadryl. Her symptoms improved but then returned yesterday. States that her lips burn and tingle. In the morning there is crusting and oozing with yellow discharge. This improves throughout the day. She threw away beeswax Chapstick but has continued to use her other chapstick that she used simultaneously with the beeswax Chapstick.  She has no history of cold sores. She denies fever, shortness of breath, chest pain, nausea, vomiting, abdominal pain.   Past Medical History:  Diagnosis Date  . Abnormal Pap smear of cervix     There are no active problems to display for this patient.   Past Surgical History:  Procedure Laterality Date  . colonoscopy      Prior to Admission medications   Medication Sig Start Date End Date Taking? Authorizing Provider  ibuprofen (ADVIL,MOTRIN) 600 MG tablet Take 1 tablet (600 mg total) by mouth every 6 (six) hours. 12/06/12   Allie Bossier, MD  mupirocin ointment (BACTROBAN) 2 % Place 1 application into the nose 2 (two) times daily. 06/16/16   Enid Derry, PA-C  ranitidine (ZANTAC) 150 MG tablet Take 1 tablet (150 mg total) by mouth 2 (two) times daily. 06/16/16 06/16/17  Enid Derry, PA-C    Allergies Patient has no known allergies.  Family History  Problem Relation Age of Onset  . Diabetes Mother   . Hypertension Mother   . Diabetes Father   . Hypertension Father     Social  History Social History  Substance Use Topics  . Smoking status: Never Smoker  . Smokeless tobacco: Never Used  . Alcohol use No     Review of Systems  Constitutional: No fever/chills Cardiovascular: No chest pain. Respiratory: No cough. No SOB. Gastrointestinal: No abdominal pain.  No nausea, no vomiting.  Musculoskeletal: Negative for musculoskeletal pain. Skin: Negative for abrasions, lacerations, ecchymosis. Neurological: Negative for headaches   ____________________________________________   PHYSICAL EXAM:  VITAL SIGNS: ED Triage Vitals [06/16/16 0935]  Enc Vitals Group     BP 133/74     Pulse Rate 72     Resp 18     Temp 99.4 F (37.4 C)     Temp Source Oral     SpO2 99 %     Weight 134 lb (60.8 kg)     Height 5\' 6"  (1.676 m)     Head Circumference      Peak Flow      Pain Score      Pain Loc      Pain Edu?      Excl. in GC?      Constitutional: Alert and oriented. Well appearing and in no acute distress. Eyes: Conjunctivae are normal. PERRL. EOMI. Head: Atraumatic. ENT:      Ears:      Nose: No congestion/rhinnorhea.      Mouth/Throat: Pinpoint vesicles over bottom lip. 1/2 cm of swelling over upper left lip. No discharge present. Mucous membranes are moist. Oropharynx nonerythematous.  Tonsils not enlarged. Neck: No stridor. Cervical lymphadenopathy. Cardiovascular: Normal rate, regular rhythm.  Good peripheral circulation. Respiratory: Normal respiratory effort without tachypnea or retractions. Lungs CTAB. Good air entry to the bases with no decreased or absent breath sounds. Musculoskeletal: Full range of motion to all extremities. No gross deformities appreciated. Neurologic:  Normal speech and language. No gross focal neurologic deficits are appreciated.  Skin:  Skin is warm, dry and intact.   ____________________________________________   LABS (all labs ordered are listed, but only abnormal results are displayed)  Labs Reviewed - No data to  display ____________________________________________  EKG   ____________________________________________  RADIOLOGY   No results found.  ____________________________________________    PROCEDURES  Procedure(s) performed:    Procedures    Medications - No data to display   ____________________________________________   INITIAL IMPRESSION / ASSESSMENT AND PLAN / ED COURSE  Pertinent labs & imaging results that were available during my care of the patient were reviewed by me and considered in my medical decision making (see chart for details).  Review of the [place] CSRS was performed in accordance of the [facility] prior to dispensing any controlled drugs.     Patient's diagnosis is consistent with contact dermatitis. It is likely cross contamination between current Chapstick and beeswax Chapstick. Upper lip looks like the beginning of a cold sore but patient has no history of cold sores and states that there is some crusting and oozing in the morning but this improves throughout the day. Symptoms also improved with Benadryl and prednisone. Because of the oozing, I'll give patient bactroban. She will continue with Benadryl and will begin Allegra or Claritin. Patient will be discharged home with prescriptions for Bactroban and ranitidine. Patient threw away Chapstick. Patient is to follow up with dermatology if symptoms do not improve. Patient is given ED precautions to return to the ED for any worsening or new symptoms.     ____________________________________________  FINAL CLINICAL IMPRESSION(S) / ED DIAGNOSES  Final diagnoses:  Irritant contact dermatitis due to other agents      NEW MEDICATIONS STARTED DURING THIS VISIT:  Discharge Medication List as of 06/16/2016 11:02 AM    START taking these medications   Details  mupirocin ointment (BACTROBAN) 2 % Place 1 application into the nose 2 (two) times daily., Starting Fri 06/16/2016, Print    ranitidine (ZANTAC) 150  MG tablet Take 1 tablet (150 mg total) by mouth 2 (two) times daily., Starting Fri 06/16/2016, Until Sat 06/16/2017, Print            This chart was dictated using voice recognition software/Dragon. Despite best efforts to proofread, errors can occur which can change the meaning. Any change was purely unintentional.    Enid Derry, PA-C 06/16/16 1159    Governor Rooks, MD 06/16/16 1536

## 2016-10-17 ENCOUNTER — Ambulatory Visit (INDEPENDENT_AMBULATORY_CARE_PROVIDER_SITE_OTHER): Payer: 59 | Admitting: Obstetrics and Gynecology

## 2016-10-17 ENCOUNTER — Telehealth: Payer: Self-pay | Admitting: Obstetrics and Gynecology

## 2016-10-17 ENCOUNTER — Encounter: Payer: Self-pay | Admitting: Obstetrics and Gynecology

## 2016-10-17 VITALS — BP 119/61 | HR 70 | Ht 66.0 in | Wt 144.2 lb

## 2016-10-17 DIAGNOSIS — Z30432 Encounter for removal of intrauterine contraceptive device: Secondary | ICD-10-CM

## 2016-10-17 MED ORDER — VITAFOL ULTRA 29-0.6-0.4-200 MG PO CAPS: 1.0000 | ORAL_CAPSULE | Freq: Every day | ORAL | 12 refills | Status: DC

## 2016-10-17 NOTE — Telephone Encounter (Signed)
Patient stated she would like another type of medicine because the one Dr Jolayne Panther sent is too expensive. Also, her Rx was sent to wrong pharmacy.

## 2016-10-17 NOTE — Progress Notes (Signed)
31 yo [id number] here for IUD removal. Patient has had it since 2014. She desires to conceive  GYNECOLOGY CLINIC PROCEDURE NOTE  Abigail [name] is a 31 y.o. [id number] here for IUD removal. No GYN concerns.  Last pap smear was in 2012 and was normal.  IUD Removal  Patient identified, informed consent performed, consent signed.  Patient was in the dorsal lithotomy position, normal external genitalia was noted.  A speculum was placed in the patient's vagina, normal discharge was noted, no lesions. The cervix was visualized, no lesions, no abnormal discharge.  The strings of the IUD were grasped and pulled using ring forceps. The IUD was removed in its entirety. Patient tolerated the procedure well.    Patient plans for pregnancy soon and she was told to avoid teratogens, take PNV and folic acid.  Routine preventative health maintenance measures emphasized. Patient to schedule annual exam in the next few weeks

## 2016-10-18 ENCOUNTER — Other Ambulatory Visit: Payer: Self-pay

## 2016-10-18 MED ORDER — PRENATAL VITAMINS 0.8 MG PO TABS: 1.0000 | ORAL_TABLET | Freq: Every day | ORAL | 12 refills | Status: DC

## 2016-10-18 NOTE — Telephone Encounter (Signed)
Patient called stated her prenatal are not covered by her insurance and they were called into the wrong pharmacy. Called new rx in and sent it to [facility] in [place].

## 2016-10-24 MED ORDER — PRENATAL VITAMINS 0.8 MG PO TABS: 1.0000 | ORAL_TABLET | Freq: Every day | ORAL | 12 refills | Status: DC

## 2016-10-24 NOTE — Addendum Note (Signed)
Addended by: Jill Side on: 10/24/2016 03:09 PM   Modules accepted: Orders

## 2016-10-24 NOTE — Telephone Encounter (Signed)
Pt needed Rx for prenatal vitamins sent to a different [facility] in [facility] [address] This was done per her request.

## 2016-10-25 NOTE — Telephone Encounter (Signed)
I called Abigail [name] and left a message I am returning her call, please call back and leave [place] a message on nurse line if you are still having the medication issue and need it to be changed.

## 2017-03-30 ENCOUNTER — Encounter: Payer: Self-pay | Admitting: Emergency Medicine

## 2017-03-30 ENCOUNTER — Emergency Department
Admission: EM | Admit: 2017-03-30 | Discharge: 2017-03-30 | Disposition: A | Discharge status: Home / Self Care | Payer: 59 | Attending: Emergency Medicine | Admitting: Emergency Medicine

## 2017-03-30 ENCOUNTER — Other Ambulatory Visit: Payer: Self-pay

## 2017-03-30 ENCOUNTER — Emergency Department: Payer: 59

## 2017-03-30 DIAGNOSIS — Z3A10 10 weeks gestation of pregnancy: Secondary | ICD-10-CM | POA: Diagnosis not present

## 2017-03-30 DIAGNOSIS — O200 Threatened abortion: Secondary | ICD-10-CM | POA: Insufficient documentation

## 2017-03-30 DIAGNOSIS — O4690 Antepartum hemorrhage, unspecified, unspecified trimester: Secondary | ICD-10-CM

## 2017-03-30 DIAGNOSIS — Z79899 Other long term (current) drug therapy: Secondary | ICD-10-CM | POA: Diagnosis not present

## 2017-03-30 DIAGNOSIS — O209 Hemorrhage in early pregnancy, unspecified: Secondary | ICD-10-CM | POA: Diagnosis not present

## 2017-03-30 DIAGNOSIS — Z3A12 12 weeks gestation of pregnancy: Secondary | ICD-10-CM | POA: Diagnosis not present

## 2017-03-30 LAB — COMPREHENSIVE METABOLIC PANEL
ALBUMIN: 3.9 g/dL (ref 3.5–5.0)
ALK PHOS: 50 U/L (ref 38–126)
ALT: 32 U/L (ref 14–54)
ANION GAP: 7 (ref 5–15)
AST: 18 U/L (ref 15–41)
BILIRUBIN TOTAL: 0.7 mg/dL (ref 0.3–1.2)
BUN: 14 mg/dL (ref 6–20)
CALCIUM: 8.6 mg/dL — AB (ref 8.9–10.3)
CO2: 24 mmol/L (ref 22–32)
Chloride: 104 mmol/L (ref 101–111)
Creatinine, Ser: 0.76 mg/dL (ref 0.44–1.00)
GFR calc Af Amer: >60 mL/min (ref >60)
GFR calc non Af Amer: >60 mL/min (ref >60)
GLUCOSE: 99 mg/dL (ref 65–99)
Potassium: 3.5 mmol/L (ref 3.5–5.1)
Sodium: 135 mmol/L (ref 135–145)
Total Protein: 7.1 g/dL (ref 6.5–8.1)

## 2017-03-30 LAB — HCG, QUANTITATIVE, PREGNANCY: hCG, Beta Chain, Quant, S: 135405 m[IU]/mL — ABNORMAL HIGH (ref <5)

## 2017-03-30 LAB — URINALYSIS, COMPLETE (UACMP) WITH MICROSCOPIC
Bilirubin Urine: NEGATIVE
GLUCOSE, UA: NEGATIVE mg/dL
Ketones, ur: NEGATIVE mg/dL
LEUKOCYTES UA: NEGATIVE
NITRITE: NEGATIVE
PH: 6.5 (ref 5.0–8.0)
Protein, ur: NEGATIVE mg/dL
Specific Gravity, Urine: 1.025 (ref 1.005–1.030)

## 2017-03-30 LAB — CBC
HEMATOCRIT: 38.1 % (ref 35.0–47.0)
Hemoglobin: 12.7 g/dL (ref 12.0–16.0)
MCH: 29.2 pg (ref 26.0–34.0)
MCHC: 33.5 g/dL (ref 32.0–36.0)
MCV: 87.1 fL (ref 80.0–100.0)
Platelets: 259 10*3/uL (ref 150–440)
RBC: 4.37 MIL/uL (ref 3.80–5.20)
RDW: 13.1 % (ref 11.5–14.5)
WBC: 9.8 10*3/uL (ref 3.6–11.0)

## 2017-03-30 LAB — POCT PREGNANCY, URINE: Preg Test, Ur: POSITIVE — AB

## 2017-03-30 NOTE — ED Provider Notes (Signed)
[facility] Emergency Department Provider Note  ____________________________________________   First MD Initiated Contact with Patient 03/30/17 [phone]     (approximate)  I have reviewed the triage vital signs and the nursing notes.   HISTORY  Chief Complaint Vaginal Bleeding    HPI Abigail [name] is a 32 y.o. female G5 P2 Ab 2 (one spontaneous, one elective) at about [redacted] weeks gestation followed at [facility]  She presents for evaluation tonight for acute onset moderate to severe vaginal bleeding.  She states she has been in her usual state of health and has had no vaginal bleeding until tonight.  She woke up at about 2:00 AM to go to the bathroom and when she was urinating it is bleeding vaginally and had what she describes as a large  Amount of bright red blood.  She states definitively that it was vaginal and not hematuria.  She denies dizziness, lightheadedness, fever, chills, chest pain, shortness of breath, vomiting, and dysuria.  She has had some nausea and she is continuing to have mild to moderate lower abdominal cramping which was not present prior to the vaginal bleeding   Past Medical History:  Diagnosis Date  . Abnormal Pap smear of cervix     There are no active problems to display for this patient.   Past Surgical History:  Procedure Laterality Date  . colonoscopy      Prior to Admission medications   Medication Sig Start Date End Date Taking? Authorizing Provider  ibuprofen (ADVIL,MOTRIN) 600 MG tablet Take 1 tablet (600 mg total) by mouth every 6 (six) hours. 12/06/12   Allie Bossier, MD  mupirocin ointment (BACTROBAN) 2 % Place 1 application into the nose 2 (two) times daily. Patient not taking: Reported on 10/17/2016 06/16/16   Enid Derry, PA-C  Prenat-Fe Poly-Methfol-FA-DHA (VITAFOL ULTRA) 29-0.6-0.4-200 MG CAPS Take 1 tablet by mouth daily. 10/17/16   Constant, Peggy, MD  Prenatal Multivit-Min-Fe-FA (PRENATAL VITAMINS) 0.8 MG tablet Take 1  tablet by mouth daily. 10/24/16   Constant, Peggy, MD  ranitidine (ZANTAC) 150 MG tablet Take 1 tablet (150 mg total) by mouth 2 (two) times daily. Patient not taking: Reported on 10/17/2016 06/16/16 06/16/17  Enid Derry, PA-C    Allergies Patient has no known allergies.  Family History  Problem Relation Age of Onset  . Diabetes Mother   . Hypertension Mother   . Diabetes Father   . Hypertension Father     Social History Social History   Tobacco Use  . Smoking status: Never Smoker  . Smokeless tobacco: Never Used  Substance Use Topics  . Alcohol use: No  . Drug use: No    Review of Systems Constitutional: No fever/chills ENT: No sore throat. Cardiovascular: Denies chest pain. Respiratory: Denies shortness of breath. Gastrointestinal: Mild to moderate lower abdominal cramping with nausea.  No vomiting, no diarrhea. Genitourinary: Acute onset bright red vaginal bleeding at [redacted] weeks gestation.  Negative for dysuria. Musculoskeletal: Negative for neck pain.  Negative for back pain. Integumentary: Negative for rash. Neurological: Negative for headaches, focal weakness or numbness. Psych:  no psychiatric complaints  ____________________________________________   PHYSICAL EXAM:  VITAL SIGNS: ED Triage Vitals  Enc Vitals Group     BP 03/30/17 0337 129/67     Pulse Rate 03/30/17 0337 82     Resp 03/30/17 0337 18     Temp 03/30/17 0337 98 F (36.7 C)     Temp Source 03/30/17 0337 Oral     SpO2  03/30/17 0337 98 %     Weight 03/30/17 0335 68.9 kg (152 lb)     Height 03/30/17 0335 1.676 m (5\' 6" )     Head Circumference --      Peak Flow --      Pain Score 03/30/17 0334 2     Pain Loc --      Pain Edu? --      Excl. in GC? --     Constitutional: Alert and oriented. Well appearing and in no acute distress. Eyes: Conjunctivae are normal.  Head: Atraumatic. Nose: No congestion/rhinnorhea. Mouth/Throat: Mucous membranes are moist. Neck: No stridor.  No meningeal  signs.   Cardiovascular: Normal rate, regular rhythm. Good peripheral circulation. Grossly normal heart sounds. Respiratory: Normal respiratory effort.  No retractions. Lungs CTAB. Gastrointestinal: Soft with mild tenderness to palpation of the lower abdomen.  No rebound and no guarding. Genitourinary: deferred, no indication for exam given reassuring U/S results Musculoskeletal: No lower extremity tenderness nor edema. No gross deformities of extremities. Neurologic:  Normal speech and language. No gross focal neurologic deficits are appreciated.  Skin:  Skin is warm, dry and intact. No rash noted. Psychiatric: Mood and affect are normal. Speech and behavior are normal.  ____________________________________________   LABS (all labs ordered are listed, but only abnormal results are displayed)  Labs Reviewed  URINALYSIS, COMPLETE (UACMP) WITH MICROSCOPIC - Abnormal; Notable for the following components:      Result Value   Hgb urine dipstick MODERATE (*)    Squamous Epithelial / LPF 0-5 (*)    Bacteria, UA RARE (*)    All other components within normal limits  HCG, QUANTITATIVE, PREGNANCY - Abnormal; Notable for the following components:   hCG, Beta Chain, Quant, S 135,405 (*)    All other components within normal limits  COMPREHENSIVE METABOLIC PANEL - Abnormal; Notable for the following components:   Calcium 8.6 (*)    All other components within normal limits  POCT PREGNANCY, URINE - Abnormal; Notable for the following components:   Preg Test, Ur POSITIVE (*)    All other components within normal limits  CBC   ____________________________________________  EKG  None - EKG not ordered by ED physician ____________________________________________  RADIOLOGY   US Ob Comp Less 14 Wks  Result Date: 03/30/2017 CLINICAL DATA:  32 year old female with positive urine pregnancy presenting with vaginal bleeding and pelvic cramping. LMP: 01/15/2017 corresponding to an estimated  gestational age of [redacted] weeks, 4 days. EXAM: OBSTETRIC <14 WK [place] AND TRANSVAGINAL OB US TECHNIQUE: Both transabdominal and transvaginal ultrasound examinations were performed for complete evaluation of the gestation as well as the maternal uterus, adnexal regions, and pelvic cul-de-sac. Transvaginal technique was performed to assess early pregnancy. COMPARISON:  None. FINDINGS: Intrauterine gestational sac: Single Yolk sac:  Seen Embryo:  Present Cardiac Activity: Detected Heart Rate: 157 bpm CRL:  47 mm   11 w   3 d                  [place] EDC: 10/16/2017 Subchorionic hemorrhage:  None visualized. Maternal uterus/adnexae: The ovaries are unremarkable. Trace free fluid within the pelvis. IMPRESSION: Single live intrauterine pregnancy with an estimated gestational age of [redacted] weeks, 3 days based on ultrasound. Electronically Signed   By: Elgie Collard M.D.   On: 03/30/2017 04:56   US Ob Transvaginal  Result Date: 03/30/2017 CLINICAL DATA:  32 year old female with positive urine pregnancy presenting with vaginal bleeding and pelvic cramping. LMP: 01/15/2017 corresponding to an  estimated gestational age of [redacted] weeks, 4 days. EXAM: OBSTETRIC <14 WK [place] AND TRANSVAGINAL OB US TECHNIQUE: Both transabdominal and transvaginal ultrasound examinations were performed for complete evaluation of the gestation as well as the maternal uterus, adnexal regions, and pelvic cul-de-sac. Transvaginal technique was performed to assess early pregnancy. COMPARISON:  None. FINDINGS: Intrauterine gestational sac: Single Yolk sac:  Seen Embryo:  Present Cardiac Activity: Detected Heart Rate: 157 bpm CRL:  47 mm   11 w   3 d                  [place] EDC: 10/16/2017 Subchorionic hemorrhage:  None visualized. Maternal uterus/adnexae: The ovaries are unremarkable. Trace free fluid within the pelvis. IMPRESSION: Single live intrauterine pregnancy with an estimated gestational age of [redacted] weeks, 3 days based on ultrasound. Electronically Signed   By: Elgie Collard M.D.   On: 03/30/2017 04:56    ____________________________________________   PROCEDURES  Critical Care performed: No   Procedure(s) performed:   Procedures   ____________________________________________   INITIAL IMPRESSION / ASSESSMENT AND PLAN / ED COURSE  As part of my medical decision making, I reviewed the following data within the electronic MEDICAL RECORD NUMBER Nursing notes reviewed and incorporated, Labs reviewed  and Notes from prior ED visits    Differential diagnosis includes, but is not limited to, threatened miscarriage, incomplete miscarriage, normal bleeding from an early trimester pregnancy, ectopic pregnancy, , blighted ovum, vaginal/cervical trauma, subchorionic hemorrhage/hematoma, etc. patient is stable appearing in no acute distress.  HCG appropriately elevated, some blood in the urine but that is to be expected.  H&H are normal and metabolic panel is normal.  Awaiting ultrasound results.    Clinical Course as of Mar 31 535  Fri Mar 30, 2017  1610 A single live fetus observed and no evidence of any acute or emergent abnormalities.  I updated the patient and her husband and stressed the need for follow-up at [facility] but also explained that there is no indication for further intervention at this time. US OB Comp Less 14 Wks [CF]  0534 We did not obtain a ABO/Rh testing, but the patient stated she is O+ and I verified in her [facility] records that that is the case.  No indication for RhoGam.  [CF]    Clinical Course User Index [CF] Loleta Rose, MD    ____________________________________________  FINAL CLINICAL IMPRESSION(S) / ED DIAGNOSES  Final diagnoses:  Vaginal bleeding in pregnancy  Threatened miscarriage in early pregnancy     MEDICATIONS GIVEN DURING THIS VISIT:  Medications - No data to display   ED Discharge Orders    None       Note:  This document was prepared using Dragon voice recognition software and may include unintentional  dictation errors.    Loleta Rose, MD 03/30/17 [phone]

## 2017-03-30 NOTE — ED Triage Notes (Addendum)
Patient ambulatory to triage with steady gait, without difficulty or distress noted; pt reports awoke with vag bleeding and cramping; approx [redacted]wks pregnant, pt at [facility] 8/5 G3P2; pt is O+ per records

## 2017-03-30 NOTE — Discharge Instructions (Signed)
You have been seen in the Emergency Department (ED) for vaginal bleeding during pregnancy, which is called a ?threatened abortion?.  Fortunately, your evaluation was reassuring, and the ultrasound showed a normal pregnancy.  Please start taking prenatal vitamins (over the counter) if you are not already doing so.  As a result of your blood type, you did not receive an injection of medication called Rhogam - please let your OB/Gyn know. ° °Please follow up as recommended above. ° °If you develop any other symptoms that concern you (including, but not limited to, persistent vomiting, worsening bleeding, abdominal or pelvic pain, or fever greater than 101), please return immediately to the Emergency Department. ° °

## 2017-04-11 DIAGNOSIS — Z3491 Encounter for supervision of normal pregnancy, unspecified, first trimester: Secondary | ICD-10-CM | POA: Diagnosis not present

## 2017-04-11 DIAGNOSIS — Z3A12 12 weeks gestation of pregnancy: Secondary | ICD-10-CM | POA: Diagnosis not present

## 2017-04-19 DIAGNOSIS — Z3481 Encounter for supervision of other normal pregnancy, first trimester: Secondary | ICD-10-CM | POA: Diagnosis not present

## 2017-04-19 DIAGNOSIS — R079 Chest pain, unspecified: Secondary | ICD-10-CM | POA: Diagnosis not present

## 2017-05-30 DIAGNOSIS — Z3A19 19 weeks gestation of pregnancy: Secondary | ICD-10-CM | POA: Diagnosis not present

## 2017-07-06 DIAGNOSIS — M25552 Pain in left hip: Secondary | ICD-10-CM | POA: Diagnosis not present

## 2017-07-06 DIAGNOSIS — O9989 Other specified diseases and conditions complicating pregnancy, childbirth and the puerperium: Secondary | ICD-10-CM | POA: Diagnosis not present

## 2017-07-06 DIAGNOSIS — G8929 Other chronic pain: Secondary | ICD-10-CM | POA: Diagnosis not present

## 2017-07-16 DIAGNOSIS — Z3A26 26 weeks gestation of pregnancy: Secondary | ICD-10-CM | POA: Diagnosis not present

## 2017-07-16 DIAGNOSIS — O364XX0 Maternal care for intrauterine death, not applicable or unspecified: Secondary | ICD-10-CM | POA: Diagnosis not present

## 2017-07-17 DIAGNOSIS — O364XX0 Maternal care for intrauterine death, not applicable or unspecified: Secondary | ICD-10-CM | POA: Diagnosis not present

## 2017-07-18 DIAGNOSIS — O364XX0 Maternal care for intrauterine death, not applicable or unspecified: Secondary | ICD-10-CM

## 2017-07-18 DIAGNOSIS — Z3A26 26 weeks gestation of pregnancy: Secondary | ICD-10-CM | POA: Diagnosis not present

## 2017-07-18 HISTORY — DX: Maternal care for intrauterine death, not applicable or unspecified: O36.4XX0

## 2017-07-25 DIAGNOSIS — O039 Complete or unspecified spontaneous abortion without complication: Secondary | ICD-10-CM | POA: Diagnosis not present

## 2017-07-30 DIAGNOSIS — N200 Calculus of kidney: Secondary | ICD-10-CM | POA: Diagnosis not present

## 2017-07-30 DIAGNOSIS — M549 Dorsalgia, unspecified: Secondary | ICD-10-CM | POA: Diagnosis not present

## 2017-07-30 DIAGNOSIS — R109 Unspecified abdominal pain: Secondary | ICD-10-CM | POA: Diagnosis not present

## 2017-08-01 ENCOUNTER — Emergency Department
Admission: EM | Admit: 2017-08-01 | Discharge: 2017-08-01 | Disposition: A | Discharge status: Home / Self Care | Payer: 59 | Attending: Emergency Medicine | Admitting: Emergency Medicine

## 2017-08-01 ENCOUNTER — Other Ambulatory Visit: Payer: Self-pay

## 2017-08-01 DIAGNOSIS — N201 Calculus of ureter: Secondary | ICD-10-CM | POA: Diagnosis not present

## 2017-08-01 DIAGNOSIS — Z79899 Other long term (current) drug therapy: Secondary | ICD-10-CM | POA: Diagnosis not present

## 2017-08-01 DIAGNOSIS — R1011 Right upper quadrant pain: Secondary | ICD-10-CM | POA: Diagnosis not present

## 2017-08-01 LAB — URINALYSIS, COMPLETE (UACMP) WITH MICROSCOPIC
BACTERIA UA: NONE SEEN
BILIRUBIN URINE: NEGATIVE
Glucose, UA: NEGATIVE mg/dL
Ketones, ur: NEGATIVE mg/dL
Nitrite: NEGATIVE
PROTEIN: NEGATIVE mg/dL
SPECIFIC GRAVITY, URINE: 1.011 (ref 1.005–1.030)
pH: 5 (ref 5.0–8.0)

## 2017-08-01 LAB — CBC WITH DIFFERENTIAL/PLATELET
BASOS ABS: 0.1 10*3/uL (ref 0–0.1)
BASOS PCT: 1 %
EOS ABS: 0.3 10*3/uL (ref 0–0.7)
EOS PCT: 3 %
HCT: 39.4 % (ref 35.0–47.0)
Hemoglobin: 13.3 g/dL (ref 12.0–16.0)
LYMPHS PCT: 23 %
Lymphs Abs: 2.2 10*3/uL (ref 1.0–3.6)
MCH: 30.3 pg (ref 26.0–34.0)
MCHC: 33.8 g/dL (ref 32.0–36.0)
MCV: 89.7 fL (ref 80.0–100.0)
MONO ABS: 0.5 10*3/uL (ref 0.2–0.9)
Monocytes Relative: 6 %
Neutro Abs: 6.5 10*3/uL (ref 1.4–6.5)
Neutrophils Relative %: 67 %
PLATELETS: 320 10*3/uL (ref 150–440)
RBC: 4.39 MIL/uL (ref 3.80–5.20)
RDW: 12.7 % (ref 11.5–14.5)
WBC: 9.6 10*3/uL (ref 3.6–11.0)

## 2017-08-01 LAB — BASIC METABOLIC PANEL
Anion gap: 6 (ref 5–15)
BUN: 19 mg/dL (ref 6–20)
CALCIUM: 8.8 mg/dL — AB (ref 8.9–10.3)
CO2: 24 mmol/L (ref 22–32)
CREATININE: 0.82 mg/dL (ref 0.44–1.00)
Chloride: 106 mmol/L (ref 101–111)
GFR calc Af Amer: >60 mL/min (ref >60)
Glucose, Bld: 98 mg/dL (ref 65–99)
POTASSIUM: 3.9 mmol/L (ref 3.5–5.1)
SODIUM: 136 mmol/L (ref 135–145)

## 2017-08-01 LAB — POCT PREGNANCY, URINE: PREG TEST UR: NEGATIVE

## 2017-08-01 MED ORDER — HYDROCODONE-ACETAMINOPHEN 5-325 MG PO TABS: 1.0000 | ORAL_TABLET | ORAL | 0 refills | Status: DC | PRN

## 2017-08-01 MED ORDER — HYDROCODONE-ACETAMINOPHEN 5-325 MG PO TABS: 1.0000 | ORAL_TABLET | ORAL | 0 refills | Status: AC | PRN

## 2017-08-01 NOTE — ED Triage Notes (Addendum)
Pt arrives to ED via POV from home with c/o RIGHT flank and RLQ pain x2 weeks. Pt reports being seen for same on Monday of this week at a [facility] and d/x'd with "kidney stones and a kidney infection". Pt was r/x'd Naproxen 500mg  BID, Tamsulosin 0.4mg  once daily, and Cefdinir 300mg  BID. Pt reports taking meds as directed, but states that "she's not sure if the medication isn't working or making it worse".

## 2017-08-01 NOTE — ED Provider Notes (Signed)
[facility] Emergency Department Provider Note ____________________________________________   First MD Initiated Contact with Patient 08/01/17 2127     (approximate)  I have reviewed the triage vital signs and the nursing notes.   HISTORY  Chief Complaint Abdominal Pain and Flank Pain    HPI Abigail [name] is a 32 y.o. female with PMH as noted below who presents with right flank pain and dark urine over the last few days, persistent course, somewhat improved with Naprosyn, and occurring after she was diagnosed with a kidney stone.  Patient states that she was at an outside hospital in [facility] 2 days ago for right flank pain, and was diagnosed with a kidney stone and a possible kidney infection.  She states that the pain medication that she was given takes a long time to kick in, and since she can only take it twice a day, she has significant pain in the interim.  She denies fever or chills, vomiting, diarrhea, or dysuria.  She states her urine was dark but is clearing up.  Past Medical History:  Diagnosis Date  . Abnormal Pap smear of cervix     There are no active problems to display for this patient.   Past Surgical History:  Procedure Laterality Date  . colonoscopy      Prior to Admission medications   Medication Sig Start Date End Date Taking? Authorizing Provider  ibuprofen (ADVIL,MOTRIN) 600 MG tablet Take 1 tablet (600 mg total) by mouth every 6 (six) hours. 12/06/12   Allie Bossier, MD  mupirocin ointment (BACTROBAN) 2 % Place 1 application into the nose 2 (two) times daily. Patient not taking: Reported on 10/17/2016 06/16/16   Enid Derry, PA-C  Prenat-Fe Poly-Methfol-FA-DHA (VITAFOL ULTRA) 29-0.6-0.4-200 MG CAPS Take 1 tablet by mouth daily. 10/17/16   Constant, Peggy, MD  Prenatal Multivit-Min-Fe-FA (PRENATAL VITAMINS) 0.8 MG tablet Take 1 tablet by mouth daily. 10/24/16   Constant, Peggy, MD  ranitidine (ZANTAC) 150 MG tablet Take 1  tablet (150 mg total) by mouth 2 (two) times daily. Patient not taking: Reported on 10/17/2016 06/16/16 06/16/17  Enid Derry, PA-C    Allergies Patient has no known allergies.  Family History  Problem Relation Age of Onset  . Diabetes Mother   . Hypertension Mother   . Diabetes Father   . Hypertension Father     Social History Social History   Tobacco Use  . Smoking status: Never Smoker  . Smokeless tobacco: Never Used  Substance Use Topics  . Alcohol use: No  . Drug use: No    Review of Systems  Constitutional: No fever. Eyes: No redness. ENT: No sore throat. Cardiovascular: Denies chest pain. Respiratory: Denies shortness of breath. Gastrointestinal: No vomiting.  Genitourinary: Negative for dysuria.  Musculoskeletal: Negative for back pain. Skin: Negative for rash. Neurological: Negative for headache.   ____________________________________________   PHYSICAL EXAM:  VITAL SIGNS: ED Triage Vitals  Enc Vitals Group     BP 08/01/17 1958 122/80     Pulse Rate 08/01/17 1958 74     Resp 08/01/17 1958 17     Temp 08/01/17 1958 98.5 F (36.9 C)     Temp Source 08/01/17 1958 Oral     SpO2 08/01/17 1958 99 %     Weight 08/01/17 1956 160 lb (72.6 kg)     Height 08/01/17 1956 5\' 6"  (1.676 m)     Head Circumference --      Peak Flow --  Pain Score 08/01/17 1955 0     Pain Loc --      Pain Edu? --      Excl. in GC? --     Constitutional: Alert and oriented. Well appearing and in no acute distress. Eyes: Conjunctivae are normal.  Head: Atraumatic. Nose: No congestion/rhinnorhea. Mouth/Throat: Mucous membranes are moist.   Neck: Normal range of motion.  Cardiovascular: Good peripheral circulation. Respiratory: Normal respiratory effort.  Gastrointestinal: Soft and nontender. No distention.  Genitourinary: No CVA tenderness. Musculoskeletal:  Extremities warm and well perfused.  Neurologic:  Normal speech and language. No gross focal neurologic  deficits are appreciated.  Skin:  Skin is warm and dry. No rash noted. Psychiatric: Mood and affect are normal. Speech and behavior are normal.  ____________________________________________   LABS (all labs ordered are listed, but only abnormal results are displayed)  Labs Reviewed  URINALYSIS, COMPLETE (UACMP) WITH MICROSCOPIC - Abnormal; Notable for the following components:      Result Value   Color, Urine YELLOW (*)    APPearance CLEAR (*)    Hgb urine dipstick MODERATE (*)    Leukocytes, UA TRACE (*)    All other components within normal limits  BASIC METABOLIC PANEL - Abnormal; Notable for the following components:   Calcium 8.8 (*)    All other components within normal limits  CBC WITH DIFFERENTIAL/PLATELET  POC URINE PREG, ED  POCT PREGNANCY, URINE   ____________________________________________  EKG   ____________________________________________  RADIOLOGY    ____________________________________________   PROCEDURES  Procedure(s) performed: No  Procedures  Critical Care performed: No ____________________________________________   INITIAL IMPRESSION / ASSESSMENT AND PLAN / ED COURSE  Pertinent labs & imaging results that were available during my care of the patient were reviewed by me and considered in my medical decision making (see chart for details).  32 year old female with PMH as noted above presents for some persistent right flank pain over the last several days as well as concerned about whether she was treated appropriately at her prior visit.  The patient states that she was uncomfortable with the fact that she was diagnosed with a presumed infection, and she also feels that the Naprosyn which she was prescribed takes too long to work, and cannot be taken often enough to control her pain.  I reviewed the past medical records in Care Everywhere.  I confirmed that the patient had a CT on 07/30/2017 showing 5 mm stone at the right ureteropelvic  junction, and UA showing significant WBCs, RBCs, and leukocyte esterase.  Urine culture shows no growth so far.  On exam today, vital signs are normal, the patient is extremely well-appearing, and the remainder the exam is unremarkable.  No CVA, flank, or abdominal tenderness.  Patient is afebrile.  Overall I suspect the patient's symptoms are primarily related to her kidney stone.  Given the negative culture and the lack of fever, I have lower suspicion for pyelonephritis as a component of this presentation.  UA today shows some improvement from the UA 2 days ago.  Plan: Basic labs to verify creatinine and RBC count.  Anticipate likely discharge home.  I will add an additional analgesic, and I have instructed the patient that since the culture is only 69 days old and there was a predominance of WBCs on the UA, that she should continue the antibiotic in case there was a component of infection.  She will be given urology referral.    ----------------------------------------- 10:42 PM on 08/01/2017 -----------------------------------------  CBC and  BMP were within normal limits.  Patient continues to appear comfortable and has had no significant pain.  I instructed the patient to continue the antibiotic and finish the full course, as well as to continue the Naprosyn as the primary pain medication, however I will give Vicodin for breakthrough pain.  We will give her a urology referral  as well.  Return precautions given, and the patient expressed understanding.     ____________________________________________   FINAL CLINICAL IMPRESSION(S) / ED DIAGNOSES  Final diagnoses:  Ureteral stone      NEW MEDICATIONS STARTED DURING THIS VISIT:  New Prescriptions   No medications on file     Note:  This document was prepared using Dragon voice recognition software and may include unintentional dictation errors.    Dionne Bucy, MD 08/01/17 2243

## 2017-08-01 NOTE — Discharge Instructions (Signed)
Continue to take the naproxen as the baseline for pain, and you may take the hydrocodone prescribed today as needed for more severe pain in between.  You should continue the antibiotic and finish the full course.  We have provided follow-up with the urologist; you can call tomorrow to make an appointment for sometime within the next 1 to 2 weeks.  Return to the ER for new, worsening, persistent severe pain, vomiting or inability to take the medications, pain that is not controlled by the medicines prescribed, or any other new or worsening symptoms that concern you.

## 2017-08-03 ENCOUNTER — Ambulatory Visit
Admission: RE | Admit: 2017-08-03 | Discharge: 2017-08-03 | Disposition: A | Discharge status: Home / Self Care | Payer: 59 | Source: Ambulatory Visit | Attending: Urology | Admitting: Urology

## 2017-08-03 ENCOUNTER — Encounter: Payer: Self-pay | Admitting: Urology

## 2017-08-03 ENCOUNTER — Ambulatory Visit (INDEPENDENT_AMBULATORY_CARE_PROVIDER_SITE_OTHER): Payer: 59 | Admitting: Urology

## 2017-08-03 VITALS — BP 115/78 | HR 87 | Resp 16 | Ht 66.0 in | Wt 159.4 lb

## 2017-08-03 DIAGNOSIS — N200 Calculus of kidney: Secondary | ICD-10-CM

## 2017-08-03 DIAGNOSIS — R109 Unspecified abdominal pain: Secondary | ICD-10-CM | POA: Diagnosis not present

## 2017-08-03 LAB — MICROSCOPIC EXAMINATION: Epithelial Cells (non renal): >10 /hpf — ABNORMAL HIGH (ref 0–10)

## 2017-08-03 LAB — URINALYSIS, COMPLETE
Bilirubin, UA: NEGATIVE
GLUCOSE, UA: NEGATIVE
KETONES UA: NEGATIVE
NITRITE UA: NEGATIVE
PROTEIN UA: NEGATIVE
Specific Gravity, UA: 1.025 (ref 1.005–1.030)
UUROB: 0.2 mg/dL (ref 0.2–1.0)
pH, UA: 5.5 (ref 5.0–7.5)

## 2017-08-03 NOTE — H&P (View-Only) (Signed)
08/03/2017 3:58 PM   Abigail [name] 1985-11-14 [id number]  Referring provider: No referring provider defined for this encounter.  Chief Complaint  Patient presents with  . Nephrolithiasis    HPI: Abigail [name] is a 32 year old female who presented to the [facility] emergency department on 07/30/2017 with onset of progressively worsening right flank pain radiating to the right lower quadrant.  There were no identifiable precipitating, aggravating or alleviating factors.  Her pain was intermittent and at its worse she rated the severity at 10/10.  She denied fever, chills, nausea or vomiting.  She did have an intrauterine pregnancy with fetal demise at 26 weeks and underwent a vaginal delivery on 07/18/2017.  She had a prior stone 4 years ago with pregnancy that she subsequently passed.   A stone protocol CT of the abdomen and pelvis was performed which showed a 5 mm right renal pelvic calculus with mild hydronephrosis.  She also had small, bilateral nonobstructing renal calculi.  Since her ED visit her pain has been well controlled with hydrocodone.  She is also taking tamsulosin.  PMH: Past Medical History:  Diagnosis Date  . Abnormal Pap smear of cervix     Surgical History: Past Surgical History:  Procedure Laterality Date  . colonoscopy      Home Medications:  Allergies as of 08/03/2017   No Known Allergies     Medication List        Accurate as of 08/03/17  3:58 PM. Always use your most recent med list.          HYDROcodone-acetaminophen 5-325 MG tablet Commonly known as:  NORCO/VICODIN Take 1 tablet by mouth every 4 (four) hours as needed for up to 5 days for severe pain.       Allergies: No Known Allergies  Family History: Family History  Problem Relation Age of Onset  . Diabetes Mother   . Hypertension Mother   . Diabetes Father   . Hypertension Father     Social History:  reports that she has never smoked. She has never used smokeless tobacco. She  reports that she does not drink alcohol or use drugs.  ROS: UROLOGY Frequent Urination?: No Hard to postpone urination?: No Burning/pain with urination?: No Get up at night to urinate?: Yes Leakage of urine?: No Urine stream starts and stops?: No Trouble starting stream?: No Do you have to strain to urinate?: No Blood in urine?: Yes Urinary tract infection?: No Sexually transmitted disease?: No Injury to kidneys or bladder?: No Painful intercourse?: No Weak stream?: No Currently pregnant?: No Vaginal bleeding?: No Last menstrual period?: 12/2016  Gastrointestinal Nausea?: No Vomiting?: No Indigestion/heartburn?: No Diarrhea?: No Constipation?: No  Constitutional Fever: No Night sweats?: Yes Weight loss?: No Fatigue?: Yes  Skin Skin rash/lesions?: No Itching?: No  Eyes Blurred vision?: No Double vision?: No  Ears/Nose/Throat Sore throat?: No Sinus problems?: No  Hematologic/Lymphatic Swollen glands?: No Easy bruising?: No  Cardiovascular Leg swelling?: No Chest pain?: Yes  Respiratory Cough?: No Shortness of breath?: No  Endocrine Excessive thirst?: No  Musculoskeletal Back pain?: Yes Joint pain?: No  Neurological Headaches?: Yes Dizziness?: No  Psychologic Depression?: No Anxiety?: No  Physical Exam: BP 115/78   Pulse 87   Resp 16   Ht 5\' 6"  (1.676 m)   Wt 159 lb 6.4 oz (72.3 kg)   LMP 01/03/2017   SpO2 97%   Breastfeeding? No   BMI 25.73 kg/m   Constitutional:  Alert and oriented, No acute distress. HEENT: [place] AT,  moist mucus membranes.  Trachea midline, no masses. Cardiovascular: No clubbing, cyanosis, or edema.  RRR Respiratory: Normal respiratory effort, no increased work of breathing.  Clear GI: Abdomen is soft, nontender, nondistended, no abdominal masses GU: No CVA tenderness Lymph: No cervical or inguinal lymphadenopathy. Skin: No rashes, bruises or suspicious lesions. Neurologic: Grossly intact, no focal deficits,  moving all 4 extremities. Psychiatric: Normal mood and affect.  Laboratory Data: Lab Results  Component Value Date   WBC 9.6 08/01/2017   HGB 13.3 08/01/2017   HCT 39.4 08/01/2017   MCV 89.7 08/01/2017   PLT 320 08/01/2017    Lab Results  Component Value Date   CREATININE 0.82 08/01/2017    Pertinent Imaging: CT images were not available for review  Assessment & Plan:   32 year old female with a 5 mm right renal pelvic calculus. She was informed that based on stone size and location that may not pass spontaneously.  A KUB was ordered.  We discussed potential treatment options including ureteroscopy and shockwave lithotripsy.  The pros and cons of each treatment were reviewed in detail.  She will be notified with her KUB results and further recommendations.    Riki Altes, MD  [facility] [address], [address] [place], [place] [address] [phone]

## 2017-08-03 NOTE — Progress Notes (Signed)
 08/03/2017 3:58 PM   Abigail [name] 10/22/1985 [id number]  Referring provider: No referring provider defined for this encounter.  Chief Complaint  Patient presents with  . Nephrolithiasis    HPI: Abigail [name] is a 31-year-old female who presented to the [place] emergency department on 07/30/2017 with onset of progressively worsening right flank pain radiating to the right lower quadrant.  There were no identifiable precipitating, aggravating or alleviating factors.  Her pain was intermittent and at its worse she rated the severity at 10/10.  She denied fever, chills, nausea or vomiting.  She did have an intrauterine pregnancy with fetal demise at 26 weeks and underwent a vaginal delivery on 07/18/2017.  She had a prior stone 4 years ago with pregnancy that she subsequently passed.   A stone protocol CT of the abdomen and pelvis was performed which showed a 5 mm right renal pelvic calculus with mild hydronephrosis.  She also had small, bilateral nonobstructing renal calculi.  Since her ED visit her pain has been well controlled with hydrocodone.  She is also taking tamsulosin.  PMH: Past Medical History:  Diagnosis Date  . Abnormal Pap smear of cervix     Surgical History: Past Surgical History:  Procedure Laterality Date  . colonoscopy      Home Medications:  Allergies as of 08/03/2017   No Known Allergies     Medication List        Accurate as of 08/03/17  3:58 PM. Always use your most recent med list.          HYDROcodone-acetaminophen 5-325 MG tablet Commonly known as:  NORCO/VICODIN Take 1 tablet by mouth every 4 (four) hours as needed for up to 5 days for severe pain.       Allergies: No Known Allergies  Family History: Family History  Problem Relation Age of Onset  . Diabetes Mother   . Hypertension Mother   . Diabetes Father   . Hypertension Father     Social History:  reports that she has never smoked. She has never used smokeless tobacco. She  reports that she does not drink alcohol or use drugs.  ROS: UROLOGY Frequent Urination?: No Hard to postpone urination?: No Burning/pain with urination?: No Get up at night to urinate?: Yes Leakage of urine?: No Urine stream starts and stops?: No Trouble starting stream?: No Do you have to strain to urinate?: No Blood in urine?: Yes Urinary tract infection?: No Sexually transmitted disease?: No Injury to kidneys or bladder?: No Painful intercourse?: No Weak stream?: No Currently pregnant?: No Vaginal bleeding?: No Last menstrual period?: 12/2016  Gastrointestinal Nausea?: No Vomiting?: No Indigestion/heartburn?: No Diarrhea?: No Constipation?: No  Constitutional Fever: No Night sweats?: Yes Weight loss?: No Fatigue?: Yes  Skin Skin rash/lesions?: No Itching?: No  Eyes Blurred vision?: No Double vision?: No  Ears/Nose/Throat Sore throat?: No Sinus problems?: No  Hematologic/Lymphatic Swollen glands?: No Easy bruising?: No  Cardiovascular Leg swelling?: No Chest pain?: Yes  Respiratory Cough?: No Shortness of breath?: No  Endocrine Excessive thirst?: No  Musculoskeletal Back pain?: Yes Joint pain?: No  Neurological Headaches?: Yes Dizziness?: No  Psychologic Depression?: No Anxiety?: No  Physical Exam: BP 115/78   Pulse 87   Resp 16   Ht 5' 6" (1.676 m)   Wt 159 lb 6.4 oz (72.3 kg)   LMP 01/03/2017   SpO2 97%   Breastfeeding? No   BMI 25.73 kg/m   Constitutional:  Alert and oriented, No acute distress. HEENT: [place] AT,   moist mucus membranes.  Trachea midline, no masses. Cardiovascular: No clubbing, cyanosis, or edema.  RRR Respiratory: Normal respiratory effort, no increased work of breathing.  Clear GI: Abdomen is soft, nontender, nondistended, no abdominal masses GU: No CVA tenderness Lymph: No cervical or inguinal lymphadenopathy. Skin: No rashes, bruises or suspicious lesions. Neurologic: Grossly intact, no focal deficits,  moving all 4 extremities. Psychiatric: Normal mood and affect.  Laboratory Data: Lab Results  Component Value Date   WBC 9.6 08/01/2017   HGB 13.3 08/01/2017   HCT 39.4 08/01/2017   MCV 89.7 08/01/2017   PLT 320 08/01/2017    Lab Results  Component Value Date   CREATININE 0.82 08/01/2017    Pertinent Imaging: CT images were not available for review  Assessment & Plan:   31-year-old female with a 5 mm right renal pelvic calculus. She was informed that based on stone size and location that may not pass spontaneously.  A KUB was ordered.  We discussed potential treatment options including ureteroscopy and shockwave lithotripsy.  The pros and cons of each treatment were reviewed in detail.  She will be notified with her KUB results and further recommendations.    Scott C Stoioff, MD  [facility] [address], [address] [place], [place] [address] [phone]  

## 2017-08-05 ENCOUNTER — Encounter: Payer: Self-pay | Admitting: Urology

## 2017-08-07 LAB — CULTURE, URINE COMPREHENSIVE

## 2017-08-08 ENCOUNTER — Telehealth: Payer: Self-pay

## 2017-08-08 DIAGNOSIS — N200 Calculus of kidney: Secondary | ICD-10-CM

## 2017-08-08 MED ORDER — AMOXICILLIN 875 MG PO TABS: 875.0000 mg | ORAL_TABLET | Freq: Two times a day (BID) | ORAL | 0 refills | Status: DC

## 2017-08-08 NOTE — Telephone Encounter (Signed)
-----   Message from Riki Altes, MD sent at 08/08/2017  8:01 AM EDT ----- Her stone is visualized on KUB.  Options as discussed last week including a brief trial of passage, shockwave lithotripsy or ureteroscopic removal.

## 2017-08-08 NOTE — Telephone Encounter (Signed)
Patient notified of both messages , abx sent to correct pharm.  Patient states her pain has stopped and she thinks she passed the stone, although she did not see it pass. Would like to go for another x-ray to confirm. Order placed

## 2017-08-08 NOTE — Telephone Encounter (Signed)
lmom for call back. I have also left pt vm about her urine culture and abx.

## 2017-08-08 NOTE — Addendum Note (Signed)
Addended by: Martha Clan on: 08/08/2017 10:44 AM   Modules accepted: Orders

## 2017-08-08 NOTE — Addendum Note (Signed)
Addended by: Martha Clan on: 08/08/2017 11:09 AM   Modules accepted: Orders

## 2017-08-08 NOTE — Telephone Encounter (Signed)
-----   Message from Riki Altes, MD sent at 08/08/2017  8:03 AM EDT ----- Urine culture did grow a low level of strep.  Please send Rx amoxicillin 875 mg twice daily for 5 days.

## 2017-08-08 NOTE — Telephone Encounter (Signed)
I have sent rx to [facility] in [place].  I attempted to reach pt to inform her, lmom for her to call our office. Please verify pharmacy as she has 3 pharmacies on file.

## 2017-08-09 ENCOUNTER — Ambulatory Visit
Admission: RE | Admit: 2017-08-09 | Discharge: 2017-08-09 | Disposition: A | Discharge status: Home / Self Care | Payer: 59 | Source: Ambulatory Visit | Attending: Urology | Admitting: Urology

## 2017-08-09 DIAGNOSIS — N202 Calculus of kidney with calculus of ureter: Secondary | ICD-10-CM | POA: Diagnosis not present

## 2017-08-09 DIAGNOSIS — N200 Calculus of kidney: Secondary | ICD-10-CM

## 2017-08-14 ENCOUNTER — Telehealth: Payer: Self-pay

## 2017-08-14 NOTE — Telephone Encounter (Signed)
-----   Message from Riki Altes, MD sent at 08/14/2017 11:32 AM EDT ----- Repeat KUB reviewed.  The stone is still present at its previous location and has not progressed.  It is less likely she will be able to pass the stone.  Treatment options again include ureteroscopy and shockwave lithotripsy.

## 2017-08-14 NOTE — Telephone Encounter (Signed)
lmom for pt to call office

## 2017-08-15 ENCOUNTER — Telehealth: Payer: Self-pay | Admitting: Urology

## 2017-08-15 NOTE — Telephone Encounter (Signed)
Patient notified and wants to set up Lithotripsy. Please schedule patient.  Thank you

## 2017-08-15 NOTE — Telephone Encounter (Signed)
Pt LMOM returning your call, asks for a return call about her results. Thanks.

## 2017-08-15 NOTE — Telephone Encounter (Signed)
LMOM for patient to return call.

## 2017-08-16 ENCOUNTER — Other Ambulatory Visit: Payer: Self-pay | Admitting: Radiology

## 2017-08-16 DIAGNOSIS — N200 Calculus of kidney: Secondary | ICD-10-CM

## 2017-08-16 NOTE — Telephone Encounter (Signed)
Patient plans to come to office today to fill out shockwave Lithotripsy paperwork & to receive instructions for the procedure.

## 2017-08-21 ENCOUNTER — Encounter: Payer: Self-pay | Admitting: *Deleted

## 2017-08-23 ENCOUNTER — Encounter
Admission: RE | Disposition: A | Discharge status: Home / Self Care | Payer: Self-pay | Source: Ambulatory Visit | Attending: Urology

## 2017-08-23 ENCOUNTER — Ambulatory Visit: Payer: 59

## 2017-08-23 ENCOUNTER — Telehealth: Payer: Self-pay | Admitting: Urology

## 2017-08-23 ENCOUNTER — Encounter: Payer: Self-pay | Admitting: *Deleted

## 2017-08-23 ENCOUNTER — Ambulatory Visit
Admission: RE | Admit: 2017-08-23 | Discharge: 2017-08-23 | Disposition: A | Discharge status: Home / Self Care | Payer: 59 | Source: Ambulatory Visit | Attending: Urology | Admitting: Urology

## 2017-08-23 ENCOUNTER — Other Ambulatory Visit: Payer: Self-pay

## 2017-08-23 DIAGNOSIS — N200 Calculus of kidney: Secondary | ICD-10-CM | POA: Insufficient documentation

## 2017-08-23 DIAGNOSIS — N1330 Unspecified hydronephrosis: Secondary | ICD-10-CM

## 2017-08-23 HISTORY — PX: EXTRACORPOREAL SHOCK WAVE LITHOTRIPSY: SHX1557

## 2017-08-23 HISTORY — DX: Maternal care for intrauterine death, not applicable or unspecified: O36.4XX0

## 2017-08-23 LAB — POCT PREGNANCY, URINE: PREG TEST UR: NEGATIVE

## 2017-08-23 SURGERY — LITHOTRIPSY, ESWL
Anesthesia: Moderate Sedation | Laterality: Right

## 2017-08-23 MED ORDER — ONDANSETRON HCL 4 MG/2ML IJ SOLN
INTRAMUSCULAR | Status: DC
Start: 2017-08-23 — End: 2017-08-23
  Filled 2017-08-23: qty 2

## 2017-08-23 MED ORDER — DIAZEPAM 5 MG PO TABS
ORAL_TABLET | ORAL | Status: AC
  Filled 2017-08-23: qty 2

## 2017-08-23 MED ORDER — SODIUM CHLORIDE 0.9 % IV SOLN
INTRAVENOUS | Status: DC
  Administered 2017-08-23: 07:00:00 via INTRAVENOUS

## 2017-08-23 MED ORDER — DIPHENHYDRAMINE HCL 25 MG PO CAPS
ORAL_CAPSULE | ORAL | Status: AC
  Filled 2017-08-23: qty 1

## 2017-08-23 MED ORDER — CIPROFLOXACIN HCL 500 MG PO TABS
ORAL_TABLET | ORAL | Status: AC
  Administered 2017-08-23: 500 mg
  Filled 2017-08-23: qty 1

## 2017-08-23 MED ORDER — DIPHENHYDRAMINE HCL 25 MG PO CAPS
25.0000 mg | ORAL_CAPSULE | ORAL | Status: AC
  Administered 2017-08-23: 25 mg via ORAL

## 2017-08-23 MED ORDER — CIPROFLOXACIN HCL 500 MG PO TABS: 500.0000 mg | ORAL_TABLET | ORAL | Status: DC

## 2017-08-23 MED ORDER — ONDANSETRON HCL 4 MG/2ML IJ SOLN: 4.0000 mg | Freq: Once | INTRAMUSCULAR | Status: DC | PRN

## 2017-08-23 MED ORDER — DIAZEPAM 5 MG PO TABS
10.0000 mg | ORAL_TABLET | ORAL | Status: AC
  Administered 2017-08-23: 10 mg via ORAL

## 2017-08-23 NOTE — Discharge Instructions (Signed)
AMBULATORY SURGERY  °DISCHARGE INSTRUCTIONS ° ° °1) The drugs that you were given will stay in your system until tomorrow so for the next 24 hours you should not: ° °A) Drive an automobile °B) Make any legal decisions °C) Drink any alcoholic beverage ° ° °2) You may resume regular meals tomorrow.  Today it is better to start with liquids and gradually work up to solid foods. ° °You may eat anything you prefer, but it is better to start with liquids, then soup and crackers, and gradually work up to solid foods. ° ° °3) Please notify your doctor immediately if you have any unusual bleeding, trouble breathing, redness and pain at the surgery site, drainage, fever, or pain not relieved by medication. ° ° ° °4) Additional Instructions: ° ° ° ° ° ° ° °Please contact your physician with any problems or Same Day Surgery at [phone], Monday through Friday 6 am to 4 pm, or [place] [facility] [place] Main number at [phone]. °

## 2017-08-23 NOTE — Telephone Encounter (Signed)
Done and I will cx the follow up  [facility]

## 2017-08-23 NOTE — Interval H&P Note (Signed)
History and Physical Interval Note:  08/23/2017 7:46 AM  Abigail [name]  has presented today for surgery, with the diagnosis of Kidney stone  The various methods of treatment have been discussed with the patient and family. After consideration of risks, benefits and other options for treatment, the patient has consented to  Procedure(s): EXTRACORPOREAL SHOCK WAVE LITHOTRIPSY (ESWL) (Right) as a surgical intervention .  The patient's history has been reviewed, patient examined, no change in status, stable for surgery.  I have reviewed the patient's chart and labs.  Questions were answered to the patient's satisfaction.     Vanna Scotland

## 2017-08-23 NOTE — Telephone Encounter (Signed)
Patient was taken to litho morning but unable to visualize stone.  In discussing this further with the patient, she has not had pain in over 2 weeks and has not been straining her urine.  I suspect she has likely passed her stone in the interim.  Unable to perform ESWL today.  We will plan for renal ultrasound in about 1 week to ensure that her hydronephrosis has resolved.  She is agreeable this plan.  We will call her with this result.  Vanna Scotland, MD

## 2017-08-24 ENCOUNTER — Encounter: Payer: Self-pay | Admitting: Urology

## 2017-08-30 ENCOUNTER — Ambulatory Visit
Admission: RE | Admit: 2017-08-30 | Discharge: 2017-08-30 | Disposition: A | Discharge status: Home / Self Care | Payer: 59 | Source: Ambulatory Visit | Attending: Urology | Admitting: Urology

## 2017-08-30 DIAGNOSIS — Z87442 Personal history of urinary calculi: Secondary | ICD-10-CM | POA: Insufficient documentation

## 2017-08-30 DIAGNOSIS — N1330 Unspecified hydronephrosis: Secondary | ICD-10-CM | POA: Diagnosis not present

## 2017-08-30 DIAGNOSIS — N1339 Other hydronephrosis: Secondary | ICD-10-CM | POA: Diagnosis not present

## 2017-08-31 ENCOUNTER — Telehealth: Payer: Self-pay

## 2017-08-31 NOTE — Telephone Encounter (Signed)
Pt informed

## 2017-08-31 NOTE — Telephone Encounter (Signed)
-----   Message from Vanna Scotland, MD sent at 08/31/2017  8:25 AM EDT ----- Looks like you passed your stone.  Great news.  Ultrasound is completely normal.  Vanna Scotland, MD

## 2017-09-10 ENCOUNTER — Ambulatory Visit: Payer: 59 | Admitting: Urology

## 2017-10-11 DIAGNOSIS — O364XX0 Maternal care for intrauterine death, not applicable or unspecified: Secondary | ICD-10-CM | POA: Diagnosis not present

## 2017-10-11 DIAGNOSIS — Z8759 Personal history of other complications of pregnancy, childbirth and the puerperium: Secondary | ICD-10-CM | POA: Diagnosis not present

## 2017-11-16 ENCOUNTER — Other Ambulatory Visit: Payer: Self-pay

## 2017-11-16 ENCOUNTER — Encounter: Payer: Self-pay | Admitting: Emergency Medicine

## 2017-11-16 ENCOUNTER — Emergency Department
Admission: EM | Admit: 2017-11-16 | Discharge: 2017-11-16 | Disposition: A | Discharge status: Home / Self Care | Payer: 59 | Attending: Emergency Medicine | Admitting: Emergency Medicine

## 2017-11-16 DIAGNOSIS — H6981 Other specified disorders of Eustachian tube, right ear: Secondary | ICD-10-CM | POA: Insufficient documentation

## 2017-11-16 DIAGNOSIS — H938X1 Other specified disorders of right ear: Secondary | ICD-10-CM | POA: Diagnosis not present

## 2017-11-16 DIAGNOSIS — H6121 Impacted cerumen, right ear: Secondary | ICD-10-CM | POA: Insufficient documentation

## 2017-11-16 DIAGNOSIS — H6991 Unspecified Eustachian tube disorder, right ear: Secondary | ICD-10-CM | POA: Diagnosis not present

## 2017-11-16 MED ORDER — CARBAMIDE PEROXIDE 6.5 % OT SOLN
5.0000 [drp] | Freq: Once | OTIC | Status: AC
  Administered 2017-11-16: 5 [drp] via OTIC
  Filled 2017-11-16: qty 15

## 2017-11-16 MED ORDER — FLUTICASONE PROPIONATE 50 MCG/ACT NA SUSP: 2.0000 | Freq: Every day | NASAL | 0 refills | Status: DC

## 2017-11-16 NOTE — ED Triage Notes (Signed)
Patient describes sensation of discomfort in her right ear.

## 2017-11-16 NOTE — ED Notes (Signed)
right ear flushed with warm water. Wax came out with flush. Pt reports can now hear better and full feeling relieved.

## 2017-11-16 NOTE — ED Provider Notes (Signed)
[facility] Emergency Department Provider Note  ____________________________________________  Time seen: Approximately 8:14 AM  I have reviewed the triage vital signs and the nursing notes.   HISTORY  Chief Complaint Ear Fullness    HPI Abigail [name] is a 32 y.o. female that presents to the emergency department for evaluation of right ear fullness for 1 week.  Patient states that her ear feels clogged.  She tried flushing ear with peroxide yesterday and noticed a drop of blood.  No fever, chills, nasal congestion, cough.   Past Medical History:  Diagnosis Date  . Abnormal Pap smear of cervix   . Fetal demise > 22 weeks, delivered, current hospitalization 07/18/2017   nsvd at 26 weeks    There are no active problems to display for this patient.   Past Surgical History:  Procedure Laterality Date  . colonoscopy    . EXTRACORPOREAL SHOCK WAVE LITHOTRIPSY Right 08/23/2017   Procedure: EXTRACORPOREAL SHOCK WAVE LITHOTRIPSY (ESWL);  Surgeon: Vanna Scotland, MD;  Location: [facility] ORS;  Service: Urology;  Laterality: Right;    Prior to Admission medications   Medication Sig Start Date End Date Taking? Authorizing Provider  amoxicillin (AMOXIL) 875 MG tablet Take 1 tablet (875 mg total) by mouth 2 (two) times daily. Patient not taking: Reported on 08/23/2017 08/08/17   Riki Altes, MD  fluticasone (FLONASE) 50 MCG/ACT nasal spray Place 2 sprays into both nostrils daily. 11/16/17 11/16/18  Enid Derry, PA-C    Allergies Patient has no known allergies.  Family History  Problem Relation Age of Onset  . Diabetes Mother   . Hypertension Mother   . Diabetes Father   . Hypertension Father     Social History Social History   Tobacco Use  . Smoking status: Never Smoker  . Smokeless tobacco: Never Used  Substance Use Topics  . Alcohol use: No  . Drug use: No     Review of Systems  Constitutional: No fever/chills ENT: No upper respiratory  complaints. Cardiovascular: No chest pain. Respiratory: No cough. No SOB. Gastrointestinal: No abdominal pain.   Musculoskeletal: Negative for musculoskeletal pain. Skin: Negative for rash, abrasions, lacerations, ecchymosis.   ____________________________________________   PHYSICAL EXAM:  VITAL SIGNS: ED Triage Vitals  Enc Vitals Group     BP 11/16/17 0751 (!) 113/59     Pulse Rate 11/16/17 0751 69     Resp 11/16/17 0751 16     Temp 11/16/17 0751 98.2 F (36.8 C)     Temp Source 11/16/17 0751 Oral     SpO2 11/16/17 0751 99 %     Weight 11/16/17 0752 160 lb (72.6 kg)     Height 11/16/17 0752 5\' 6"  (1.676 m)     Head Circumference --      Peak Flow --      Pain Score 11/16/17 0751 0     Pain Loc --      Pain Edu? --      Excl. in GC? --      Constitutional: Alert and oriented. Well appearing and in no acute distress. Eyes: Conjunctivae are normal. PERRL. EOMI. Head: Atraumatic. ENT:      Ears: Moderate cerumen in right ear canal.  Tympanic membranes are pearly.      Nose: No congestion/rhinnorhea.      Mouth/Throat: Mucous membranes are moist.  Neck: No stridor.  Cardiovascular: Normal rate, regular rhythm.  Good peripheral circulation. Respiratory: Normal respiratory effort without tachypnea or retractions. Lungs CTAB. Good air entry  to the bases with no decreased or absent breath sounds. Musculoskeletal: Full range of motion to all extremities. No gross deformities appreciated. Neurologic:  Normal speech and language. No gross focal neurologic deficits are appreciated.  Skin:  Skin is warm, dry and intact. No rash noted. Psychiatric: Mood and affect are normal. Speech and behavior are normal. Patient exhibits appropriate insight and judgement.   ____________________________________________   LABS (all labs ordered are listed, but only abnormal results are displayed)  Labs Reviewed - No data to  display ____________________________________________  EKG   ____________________________________________  RADIOLOGY   No results found.  ____________________________________________    PROCEDURES  Procedure(s) performed:    .Ear Cerumen Removal Date/Time: 11/16/2017 8:54 AM Performed by: Enid Derry, PA-C Authorized by: Enid Derry, PA-C   Consent:    Consent obtained:  Verbal   Consent given by:  Patient   Risks discussed:  Bleeding, dizziness, incomplete removal, infection, pain and TM perforation   Alternatives discussed:  No treatment and alternative treatment Procedure details:    Location:  R ear   Procedure type: irrigation   Post-procedure details:    Inspection:  Bleeding   Hearing quality:  Improved   Patient tolerance of procedure:  Tolerated well, no immediate complications      Medications  carbamide peroxide (DEBROX) 6.5 % OTIC (EAR) solution 5 drop (5 drops Right EAR Given 11/16/17 0841)     ____________________________________________   INITIAL IMPRESSION / ASSESSMENT AND PLAN / ED COURSE  Pertinent labs & imaging results that were available during my care of the patient were reviewed by me and considered in my medical decision making (see chart for details).  Review of the [place] CSRS was performed in accordance of the [facility] prior to dispensing any controlled drugs.    Patient's diagnosis is consistent with cerumen and eustachian tube dysfunction.  Vital signs and exam are reassuring.  Cerumen was removed in ED. Hearing and symptoms improved. Patient will be discharged home with prescriptions for Flonase. Patient is to follow up with PCP as directed. Patient is given ED precautions to return to the ED for any worsening or new symptoms.     ____________________________________________  FINAL CLINICAL IMPRESSION(S) / ED DIAGNOSES  Final diagnoses:  Impacted cerumen of right ear  Dysfunction of right eustachian tube      NEW  MEDICATIONS STARTED DURING THIS VISIT:  ED Discharge Orders         Ordered    fluticasone (FLONASE) 50 MCG/ACT nasal spray  Daily     11/16/17 0921              This chart was dictated using voice recognition software/Dragon. Despite best efforts to proofread, errors can occur which can change the meaning. Any change was purely unintentional.    Enid Derry, PA-C 11/16/17 1424    Don Perking, [place], MD 11/18/17 1324

## 2017-11-16 NOTE — ED Triage Notes (Signed)
Patient complaining of right ear fullness X 1 wk, has tried OTC product with no relief.  Denies recent URI.

## 2018-01-02 DIAGNOSIS — Z3009 Encounter for other general counseling and advice on contraception: Secondary | ICD-10-CM | POA: Diagnosis not present

## 2018-01-28 DIAGNOSIS — Z30430 Encounter for insertion of intrauterine contraceptive device: Secondary | ICD-10-CM | POA: Diagnosis not present

## 2018-03-07 DIAGNOSIS — Z30431 Encounter for routine checking of intrauterine contraceptive device: Secondary | ICD-10-CM | POA: Diagnosis not present

## 2018-03-07 DIAGNOSIS — O364XX0 Maternal care for intrauterine death, not applicable or unspecified: Secondary | ICD-10-CM | POA: Diagnosis not present

## 2018-04-12 IMAGING — US US OB TRANSVAGINAL
1 series · 14 of 28 positions shown · non-contrast
Comparison: None.

CLINICAL DATA: 31-year-old female with positive urine pregnancy
presenting with vaginal bleeding and pelvic cramping. LMP:
01/15/2017 corresponding to an estimated gestational age of 10
weeks, 4 days.

EXAM:
OBSTETRIC <14 WK US AND TRANSVAGINAL OB US
TECHNIQUE: Both transabdominal and transvaginal ultrasound examinations were
performed for complete evaluation of the gestation as well as the
maternal uterus, adnexal regions, and pelvic cul-de-sac.
Transvaginal technique was performed to assess early pregnancy.

[Series 1: us ob transvaginal · 0.26mm/px · 14 of 61 slices shown]
[im 3/61]
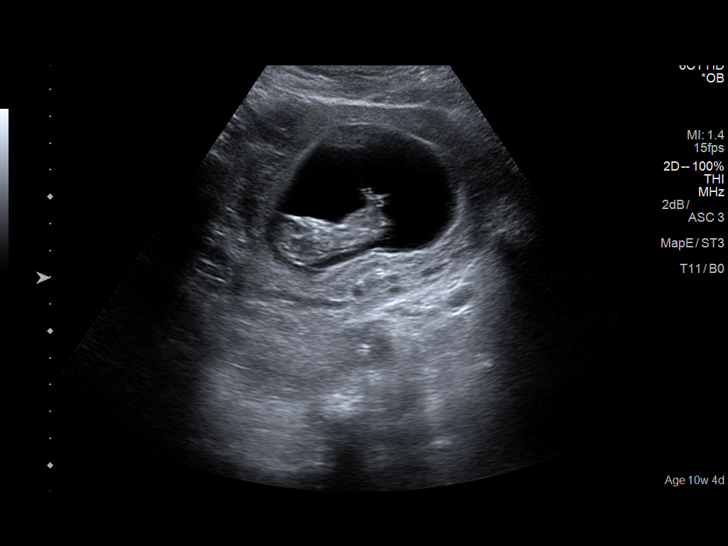
[im 7/61]
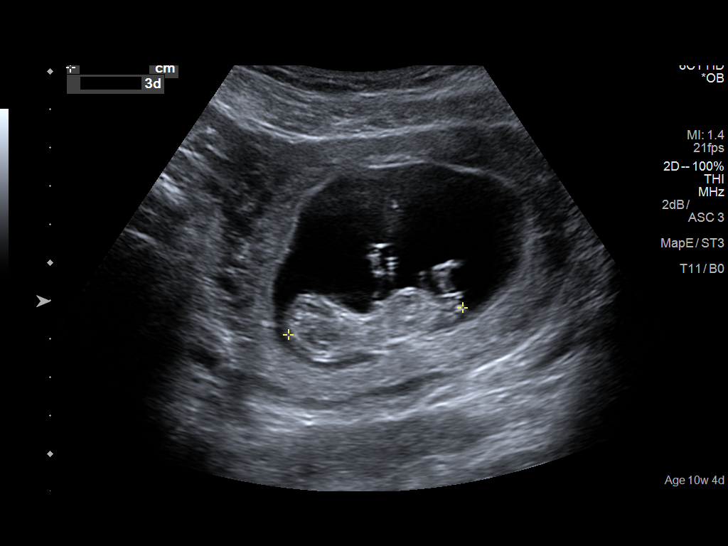
[im 12/61]
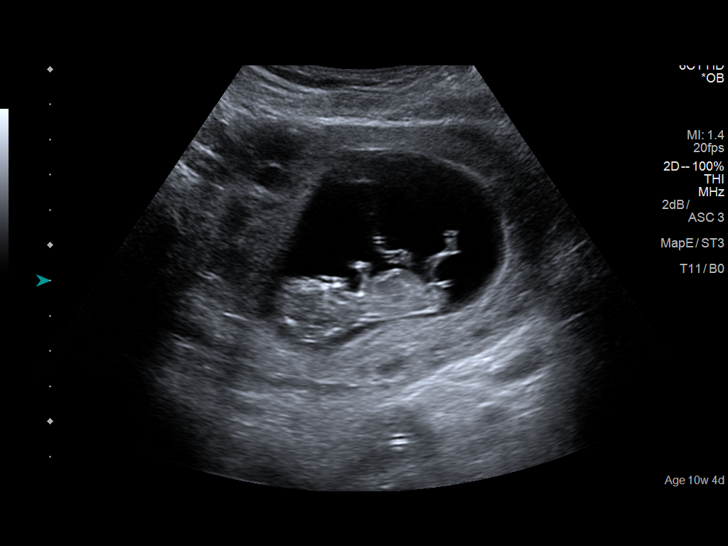
[im 16/61]
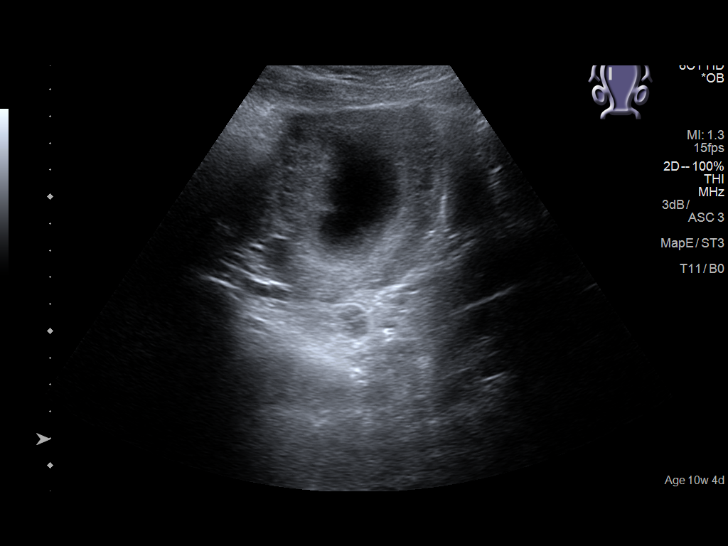
[im 21/61]
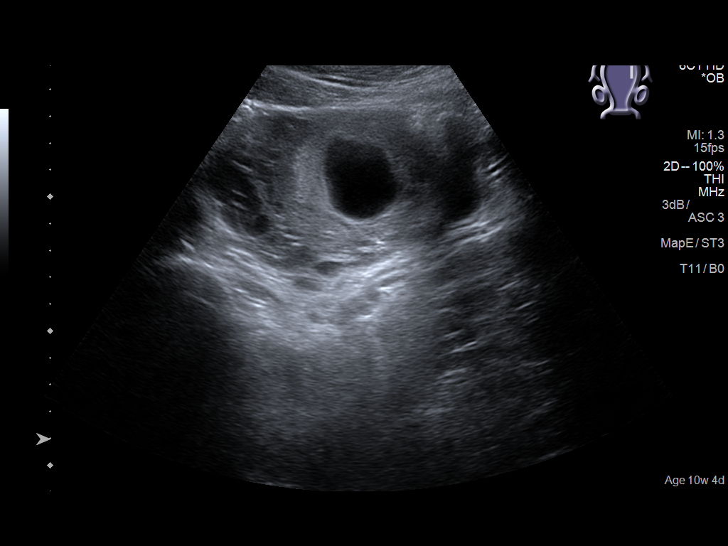
[im 25/61]
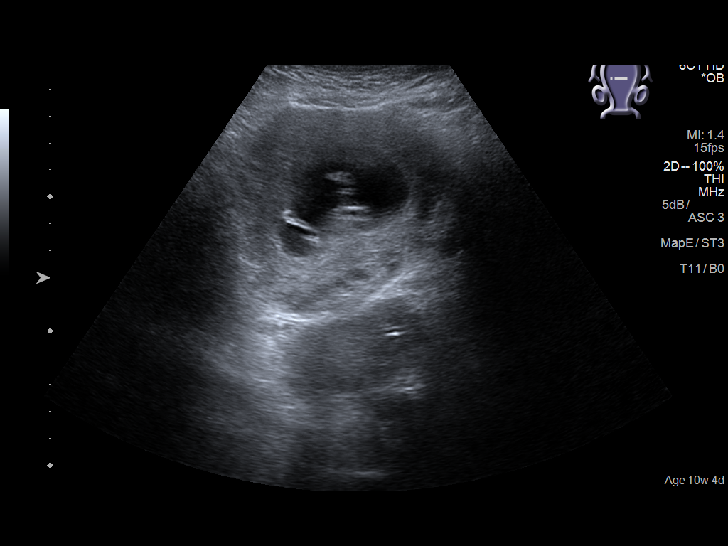
[im 29/61]
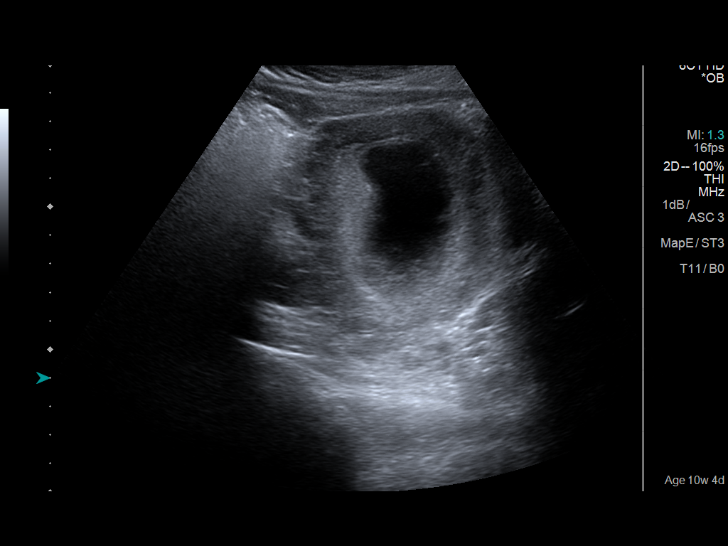
[im 34/61]
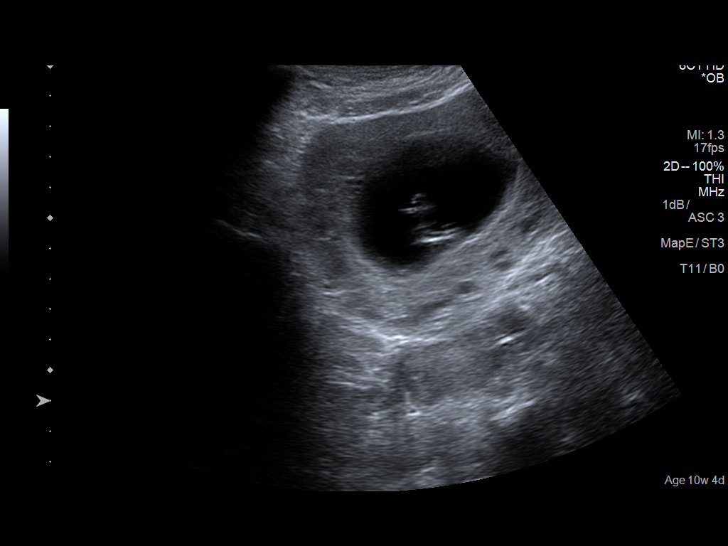
[im 38/61]
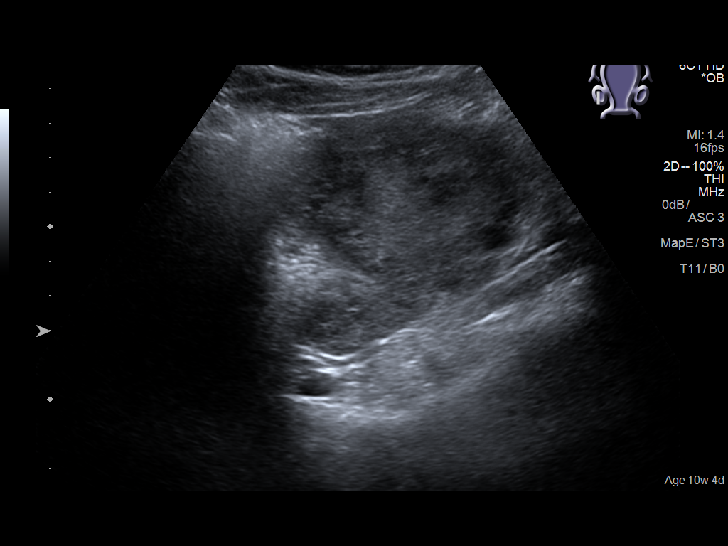
[im 43/61]
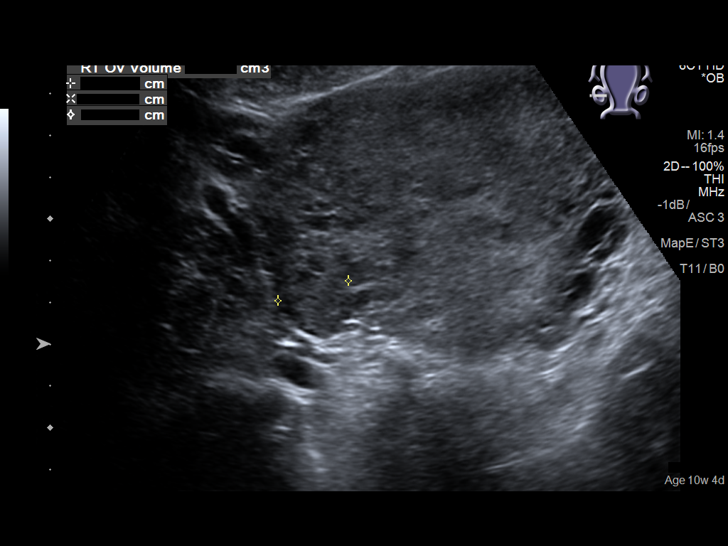
[im 47/61]
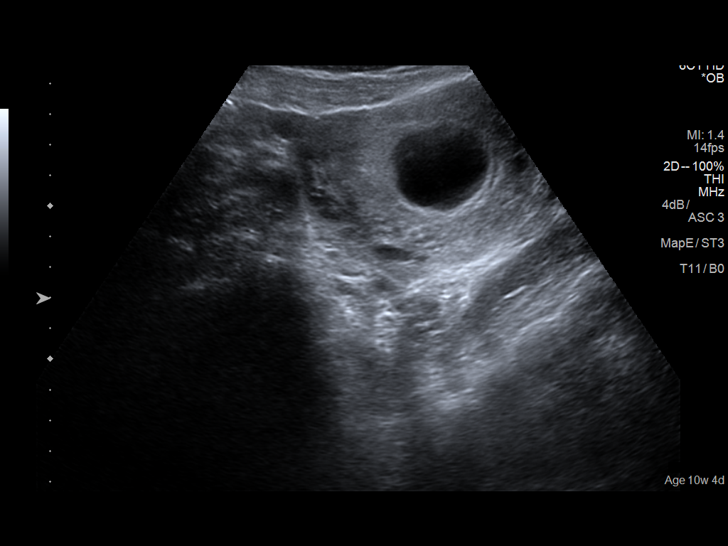
[im 52/61]
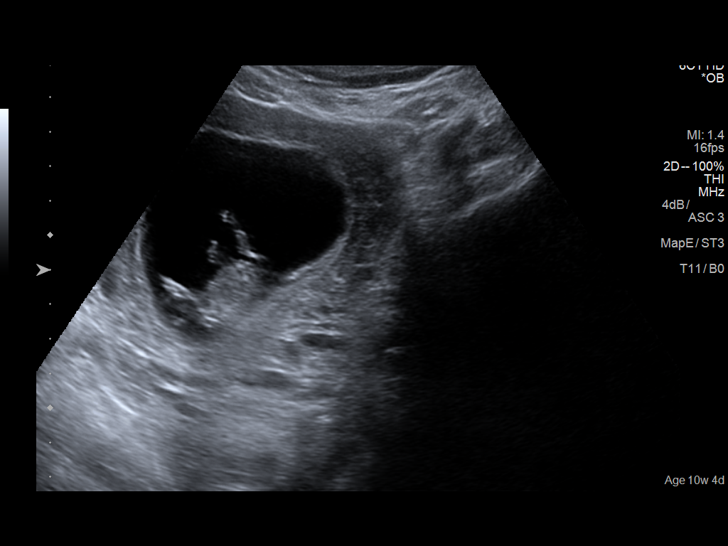
[im 56/61]
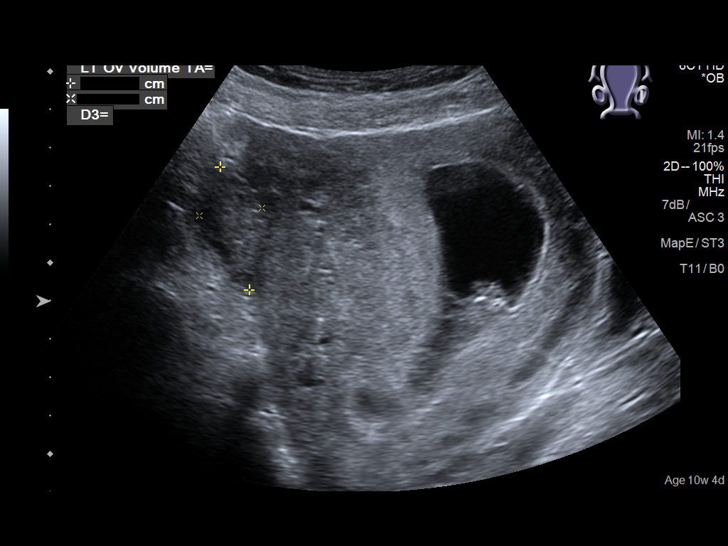
[im 61/61]
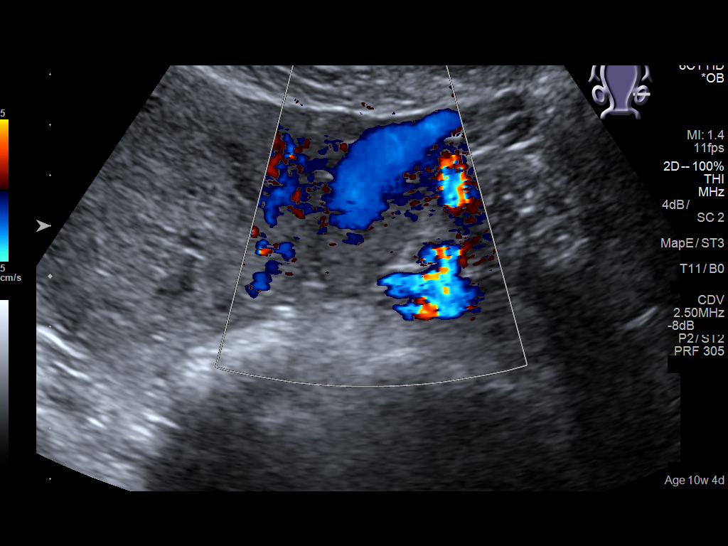

[14 of 28 positions shown; findings below may reference images not displayed]

FINDINGS: Intrauterine gestational sac: Single

Yolk sac:  Seen

Embryo:  Present

Cardiac Activity: Detected

Heart Rate: 157 bpm

CRL:  47 mm   11 w   3 d                  US EDC: 10/16/2017

Subchorionic hemorrhage:  None visualized.

Maternal uterus/adnexae: The ovaries are unremarkable.

Trace free fluid within the pelvis.
IMPRESSION: Single live intrauterine pregnancy with an estimated gestational age
of 11 weeks, 3 days based on ultrasound.

## 2019-05-27 ENCOUNTER — Other Ambulatory Visit: Payer: Self-pay | Admitting: Acute Care

## 2019-05-27 DIAGNOSIS — G35 Multiple sclerosis: Secondary | ICD-10-CM

## 2019-06-04 ENCOUNTER — Ambulatory Visit
Admission: RE | Admit: 2019-06-04 | Discharge: 2019-06-04 | Disposition: A | Discharge status: Home / Self Care | Payer: 59 | Source: Ambulatory Visit | Attending: Acute Care | Admitting: Acute Care

## 2019-06-04 ENCOUNTER — Other Ambulatory Visit: Payer: Self-pay

## 2019-06-04 DIAGNOSIS — G35 Multiple sclerosis: Secondary | ICD-10-CM | POA: Diagnosis not present

## 2019-06-04 MED ORDER — GADOBUTROL 1 MMOL/ML IV SOLN
7.0000 mL | Freq: Once | INTRAVENOUS | Status: AC | PRN
  Administered 2019-06-04: 7 mL via INTRAVENOUS

## 2019-09-30 IMAGING — CR DG ABDOMEN 1V
1 series · 1 of 1 positions shown · non-contrast
Comparison: 03/13/2011

CLINICAL DATA: Nephrolithiasis.  Right flank pain.

EXAM:
ABDOMEN - 1 VIEW

[dg abd 1 view]
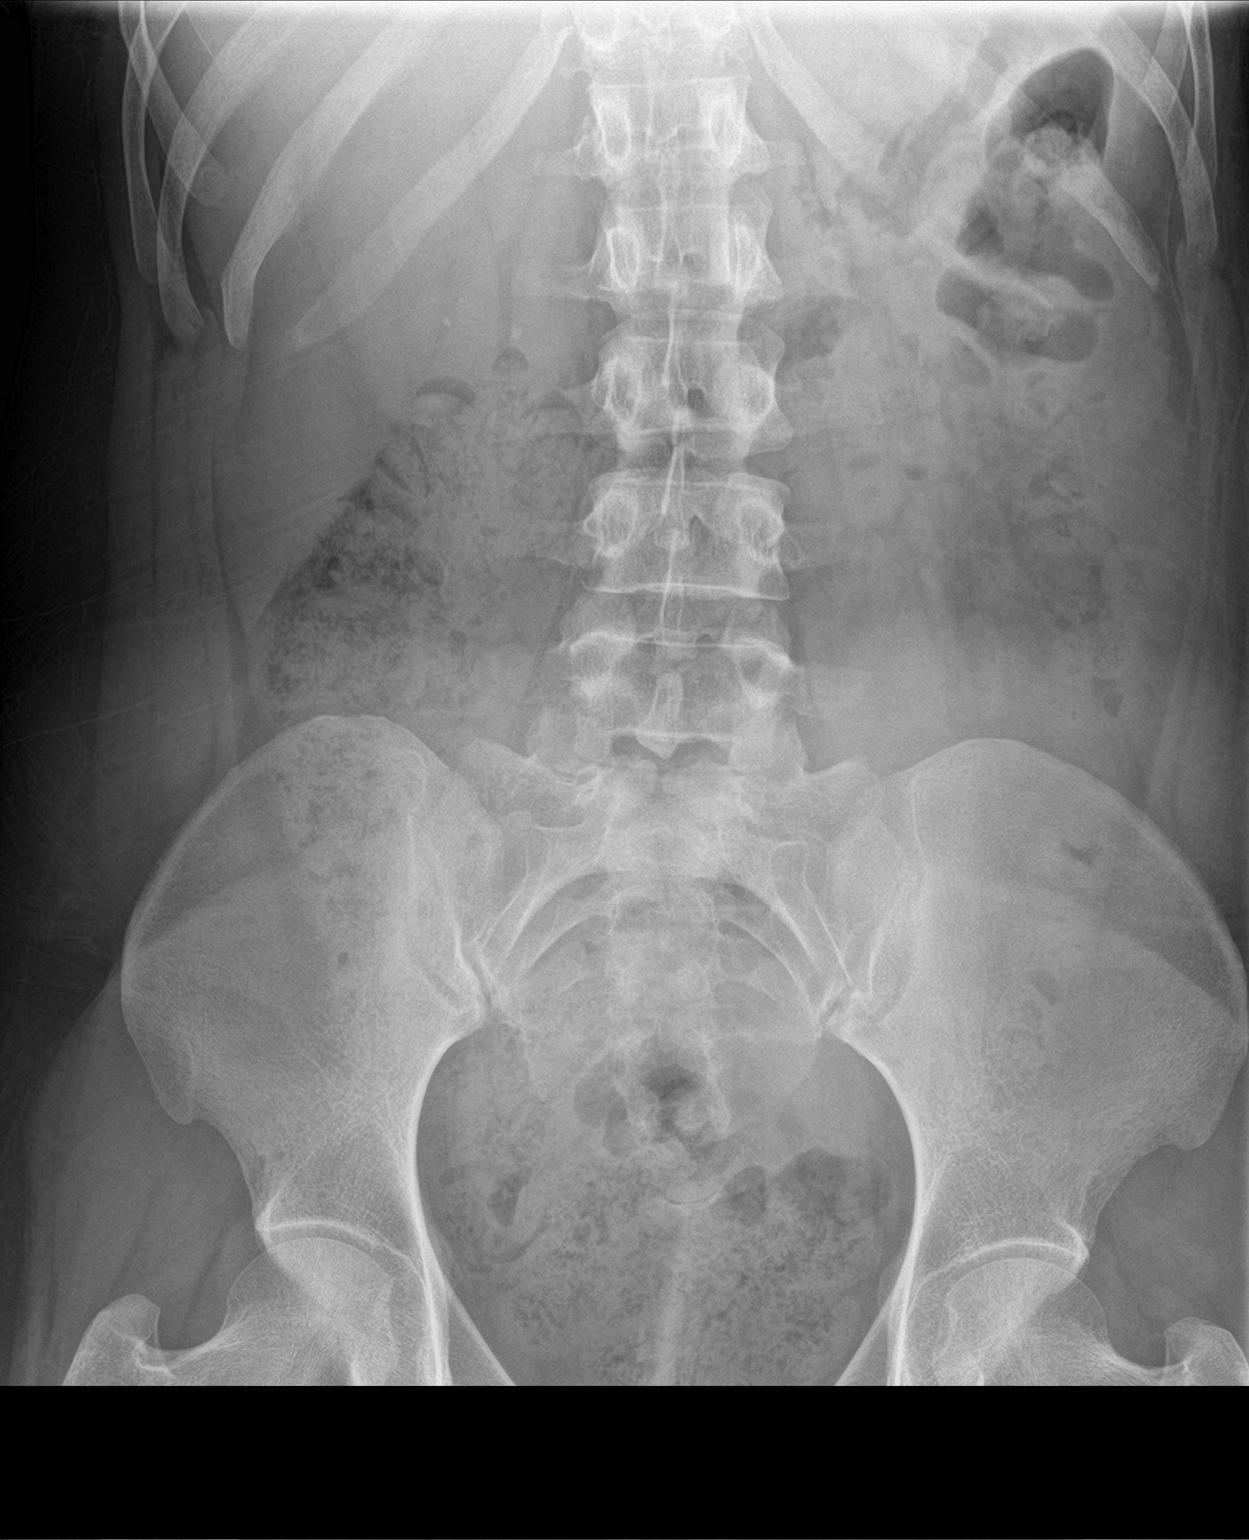

[1 of 1 positions shown; findings below may reference images not displayed]

FINDINGS: Small calcifications project over the lower pole of the right renal
shadow. A 4 mm calcification projects over the region of the
proximal right ureter. Prominent stool burden in the ascending and
sigmoid colon. No disproportionate dilatation of small bowel. No
obvious free intraperitoneal gas.
IMPRESSION: Possible right nephrolithiasis and possible proximal right ureteral
calculus. CT may be helpful to further characterize.

Nonobstructive bowel gas pattern.

## 2019-10-06 IMAGING — CR DG ABDOMEN 1V
1 series · 1 of 1 positions shown · non-contrast
Comparison: 08/03/2017

CLINICAL DATA: Nephrolithiasis

EXAM:
ABDOMEN - 1 VIEW

[dg abd 1 view]
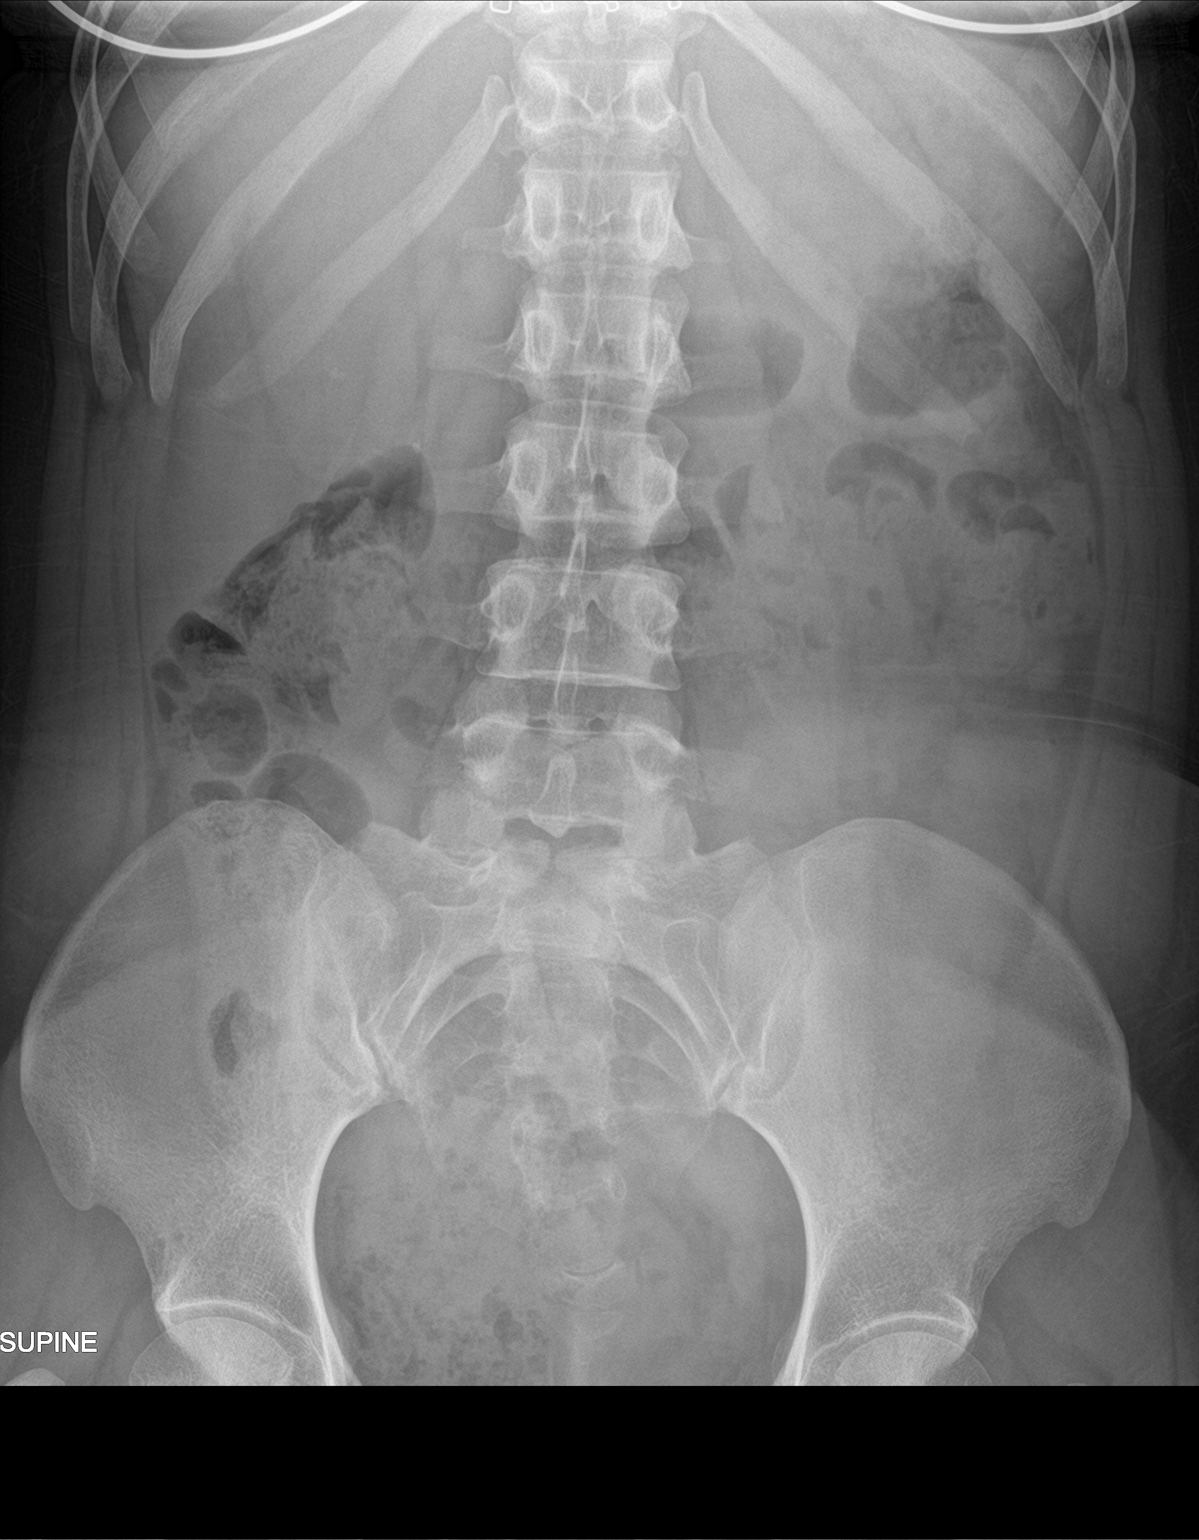

[1 of 1 positions shown; findings below may reference images not displayed]

FINDINGS: 4 mm calculus projects over inferior pole of RIGHT kidney.

Tiny adjacent 2 mm calculi project over LEFT kidney.

Additional 3 mm RIGHT UPJ calculus.

No additional ureteral calcifications.

Bowel gas pattern normal.

Osseous structures unremarkable.
IMPRESSION: 3 mm RIGHT UPJ calculus.

Additional tiny BILATERAL nonobstructing renal calculi.

## 2020-12-13 ENCOUNTER — Other Ambulatory Visit: Payer: Self-pay

## 2020-12-13 ENCOUNTER — Encounter: Payer: Self-pay | Admitting: Nurse Practitioner

## 2020-12-13 ENCOUNTER — Ambulatory Visit: Payer: 59 | Admitting: Nurse Practitioner

## 2020-12-13 VITALS — BP 113/71 | HR 78 | Temp 97.8°F | Resp 16 | Ht 66.0 in | Wt 166.0 lb

## 2020-12-13 DIAGNOSIS — E559 Vitamin D deficiency, unspecified: Secondary | ICD-10-CM | POA: Diagnosis not present

## 2020-12-13 DIAGNOSIS — L659 Nonscarring hair loss, unspecified: Secondary | ICD-10-CM | POA: Diagnosis not present

## 2020-12-13 DIAGNOSIS — E785 Hyperlipidemia, unspecified: Secondary | ICD-10-CM

## 2020-12-13 DIAGNOSIS — E538 Deficiency of other specified B group vitamins: Secondary | ICD-10-CM

## 2020-12-13 DIAGNOSIS — R5383 Other fatigue: Secondary | ICD-10-CM

## 2020-12-13 DIAGNOSIS — D649 Anemia, unspecified: Secondary | ICD-10-CM

## 2020-12-13 DIAGNOSIS — Z7689 Persons encountering health services in other specified circumstances: Secondary | ICD-10-CM

## 2020-12-13 NOTE — Progress Notes (Signed)
[facility] [place], [place] [address]  Internal MEDICINE  Office Visit Note  Patient Name: Abigail [name]  [id number]  [id number]  Date of Service: 12/13/2020   Complaints/HPI Pt is here for establishment of PCP. Chief Complaint  Patient presents with   New Patient (Initial Visit)   HPI Abigail [name] presents for a new patient visit to establish care.  She has a history of an abnormal Pap several years ago, kidney stones, fetal demise at 101 weeks.  She had a colonoscopy at 35 years old for diverticula which were unobstructed.  She has significant family history of diabetes and hypertension, no family history of cancer that she knows of.  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  She lives at home with her husband and 2 children.  She reports that she has a healthy diet and does not eat fried foods.  She reports that she used to work out but is not currently partaking in physical activity.  She is a non-smoker and denies alcohol use and recreational drug use.  She denies any pain.  Her biggest concern right now is feeling fatigued all the time.  She reports and intolerance to cold and is worried she could be anemic.  She denies any migraines, joint pains, back pain, abdominal pain or additional GI symptoms, chest pain, palpitations, shortness of breath, or fever.  She does report a decreased appetite and has noticed increased weight since she lost her daughter at 36 weeks.  She has not had any recent lab work done.  She goes to [facility] in [place] for her women's health care.  Her blood pressure is within normal limits, her BMI is slightly overweight but her overall appearance is appropriate.   Current Medication: Outpatient Encounter Medications as of 12/13/2020  Medication Sig   [DISCONTINUED] amoxicillin (AMOXIL) 875 MG tablet Take 1 tablet (875 mg total) by mouth 2 (two) times daily. (Patient not taking: No sig reported)    [DISCONTINUED] fluticasone (FLONASE) 50 MCG/ACT nasal spray Place 2 sprays into both nostrils daily.   No facility-administered encounter medications on file as of 12/13/2020.    Surgical History: Past Surgical History:  Procedure Laterality Date   colonoscopy     EXTRACORPOREAL SHOCK WAVE LITHOTRIPSY Right 08/23/2017   Procedure: EXTRACORPOREAL SHOCK WAVE LITHOTRIPSY (ESWL);  Surgeon: Hollice Espy, MD;  Location: [facility] ORS;  Service: Urology;  Laterality: Right;    Medical History: Past Medical History:  Diagnosis Date   Abnormal Pap smear of cervix    Fetal demise > 22 weeks, delivered, current hospitalization 07/18/2017   nsvd at 26 weeks    Family History: Family History  Problem Relation Age of Onset   Diabetes Mother    Hypertension Mother    Diabetes Father    Hypertension Father     Social History   Socioeconomic History   Marital status: Married    Spouse name: Not on file   Number of children: Not on file   Years of education: Not on file   Highest education level: Not on file  Occupational History   Not on file  Tobacco Use   Smoking status: Never   Smokeless tobacco: Never  Vaping Use   Vaping Use: Never used  Substance and Sexual Activity   Alcohol use: No   Drug use: No   Sexual activity: Yes    Birth control/protection: I.U.D.  Other Topics Concern   Not on file  Social History Narrative   Not on file   Social Determinants of Health   Financial Resource Strain: Not on file  Food Insecurity: Not on file  Transportation Needs: Not on file  Physical Activity: Not on file  Stress: Not on file  Social Connections: Not on file  Intimate Partner Violence: Not on file     Review of Systems  Constitutional:  Positive for appetite change, fatigue and unexpected weight change. Negative for chills and fever.  Eyes: Negative.   Respiratory: Negative.  Negative for cough, chest tightness, shortness of breath and wheezing.   Cardiovascular:  Negative.  Negative for chest pain and palpitations.  Gastrointestinal:  Negative for abdominal pain.  Endocrine: Positive for cold intolerance and polyphagia. Negative for polydipsia and polyuria.  Musculoskeletal: Negative.   Skin:  Negative for rash.  Psychiatric/Behavioral:  Positive for sleep disturbance. Negative for self-injury and suicidal ideas.    Vital Signs: BP 113/71   Pulse 78   Temp 97.8 F (36.6 C)   Resp 16   Ht _0  (1.676 m)   Wt 166 lb (75.3 kg)   SpO2 97%   BMI 26.79 kg/m    Physical Exam Vitals reviewed.  Constitutional:      General: She is not in acute distress.    Appearance: Normal appearance. She is normal weight. She is not ill-appearing.  HENT:     Head: Normocephalic and atraumatic.  Eyes:     Extraocular Movements: Extraocular movements intact.     Pupils: Pupils are equal, round, and reactive to light.  Cardiovascular:     Rate and Rhythm: Normal rate and regular rhythm.  Pulmonary:     Effort: Pulmonary effort is normal. No respiratory distress.  Neurological:     Mental Status: She is alert and oriented to person, place, and time.     Cranial Nerves: No cranial nerve deficit.     Coordination: Coordination normal.     Gait: Gait normal.      Assessment/Plan: 1. Other fatigue Several labs have been ordered to evaluate possible etiologies for fatigue.  Labs ordered to rule out anemia, iron deficiency, vitamin deficiencies, hypothyroidism, electrolyte imbalance, and others. - CMP14+EGFR  2. Hair loss disorder Patient reports hair loss in addition to other symptoms discussed.  Labs ordered. - TSH + free T4 - CMP14+EGFR  3. B12 deficiency - B12 and Folate Panel  4. Vitamin D deficiency - Vitamin D (25 hydroxy)  5. Hyperlipidemia, unspecified hyperlipidemia type - Lipid Profile  6. Anemia, unspecified type - CBC with Differential/Platelet - Iron, TIBC and Ferritin Panel - B12 and Folate Panel  7. Encounter to establish  care with new doctor Patient establishing care with new PCP.  Labs ordered to establish baseline and eval fatigue.  Patient was present last week when her husband establish care as well.    General Counseling: Abigail [name] verbalizes understanding of the findings of todays visit and agrees with plan of treatment. I have discussed any further diagnostic evaluation that may be needed or ordered today. We also reviewed her medications today. she has been encouraged to call the office with any questions or concerns that should arise related to todays visit.    Counseling:  [place] Controlled Substance Database was reviewed by me.  Orders Placed This Encounter  Procedures   CBC with Differential/Platelet   Iron, TIBC and Ferritin Panel   B12 and Folate Panel   Vitamin D (25 hydroxy)   TSH + free T4  CMP14+EGFR   Lipid Profile    No orders of the defined types were placed in this encounter.  Return in about 3 weeks (around 01/03/2021) for CPE, Review labs/test, Ashlea Dusing PCP.  Time spent:30 Minutes  This patient was seen by Jonetta Osgood, FNP-C in collaboration with Dr. Clayborn Bigness as a part of collaborative care agreement.  Ajani Schnieders R. Valetta Fuller, MSN, FNP-C Internal Medicine

## 2021-01-14 LAB — CMP14+EGFR
ALT: 11 IU/L (ref 0–32)
AST: 17 IU/L (ref 0–40)
Albumin/Globulin Ratio: 2.1 (ref 1.2–2.2)
Albumin: 4.8 g/dL (ref 3.8–4.8)
Alkaline Phosphatase: 81 IU/L (ref 44–121)
BUN/Creatinine Ratio: 16 (ref 9–23)
BUN: 15 mg/dL (ref 6–20)
Bilirubin Total: 0.2 mg/dL (ref 0.0–1.2)
CO2: 24 mmol/L (ref 20–29)
Calcium: 9.7 mg/dL (ref 8.7–10.2)
Chloride: 104 mmol/L (ref 96–106)
Creatinine, Ser: 0.95 mg/dL (ref 0.57–1.00)
Globulin, Total: 2.3 g/dL (ref 1.5–4.5)
Glucose: 102 mg/dL — ABNORMAL HIGH (ref 70–99)
Potassium: 4.1 mmol/L (ref 3.5–5.2)
Sodium: 142 mmol/L (ref 134–144)
Total Protein: 7.1 g/dL (ref 6.0–8.5)
eGFR: 81 mL/min/{1.73_m2} (ref >59)

## 2021-01-14 LAB — CBC WITH DIFFERENTIAL/PLATELET
Basophils Absolute: 0.1 10*3/uL (ref 0.0–0.2)
Basos: 1 %
EOS (ABSOLUTE): 0.4 10*3/uL (ref 0.0–0.4)
Eos: 5 %
Hematocrit: 39.2 % (ref 34.0–46.6)
Hemoglobin: 12.9 g/dL (ref 11.1–15.9)
Immature Grans (Abs): 0 10*3/uL (ref 0.0–0.1)
Immature Granulocytes: 0 %
Lymphocytes Absolute: 3 10*3/uL (ref 0.7–3.1)
Lymphs: 36 %
MCH: 28.1 pg (ref 26.6–33.0)
MCHC: 32.9 g/dL (ref 31.5–35.7)
MCV: 85 fL (ref 79–97)
Monocytes Absolute: 0.6 10*3/uL (ref 0.1–0.9)
Monocytes: 7 %
Neutrophils Absolute: 4.2 10*3/uL (ref 1.4–7.0)
Neutrophils: 51 %
Platelets: 346 10*3/uL (ref 150–450)
RBC: 4.59 x10E6/uL (ref 3.77–5.28)
RDW: 11.9 % (ref 11.7–15.4)
WBC: 8.2 10*3/uL (ref 3.4–10.8)

## 2021-01-14 LAB — IRON,TIBC AND FERRITIN PANEL
Ferritin: 105 ng/mL (ref 15–150)
Iron Saturation: 18 % (ref 15–55)
Iron: 46 ug/dL (ref 27–159)
Total Iron Binding Capacity: 262 ug/dL (ref 250–450)
UIBC: 216 ug/dL (ref 131–425)

## 2021-01-14 LAB — LIPID PANEL
Chol/HDL Ratio: 4 ratio (ref 0.0–4.4)
Cholesterol, Total: 170 mg/dL (ref 100–199)
HDL: 42 mg/dL (ref >39)
LDL Chol Calc (NIH): 113 mg/dL — ABNORMAL HIGH (ref 0–99)
Triglycerides: 79 mg/dL (ref 0–149)
VLDL Cholesterol Cal: 15 mg/dL (ref 5–40)

## 2021-01-14 LAB — B12 AND FOLATE PANEL
Folate: 8.7 ng/mL (ref >3.0)
Vitamin B-12: 610 pg/mL (ref 232–1245)

## 2021-01-14 LAB — TSH+FREE T4
Free T4: 1.27 ng/dL (ref 0.82–1.77)
TSH: 1.37 u[IU]/mL (ref 0.450–4.500)

## 2021-01-14 LAB — VITAMIN D 25 HYDROXY (VIT D DEFICIENCY, FRACTURES): Vit D, 25-Hydroxy: 20.8 ng/mL — ABNORMAL LOW (ref 30.0–100.0)

## 2021-01-14 NOTE — Progress Notes (Signed)
I have reviewed the lab results. There are no critically abnormal values requiring immediate intervention but there are some abnormals that will be discussed at the next office visit on Monday 10/31.

## 2021-01-17 ENCOUNTER — Other Ambulatory Visit: Payer: Self-pay

## 2021-01-17 ENCOUNTER — Ambulatory Visit (INDEPENDENT_AMBULATORY_CARE_PROVIDER_SITE_OTHER): Payer: 59 | Admitting: Nurse Practitioner

## 2021-01-17 ENCOUNTER — Encounter: Payer: Self-pay | Admitting: Nurse Practitioner

## 2021-01-17 VITALS — BP 123/71 | HR 82 | Temp 98.6°F | Resp 16 | Ht 66.0 in | Wt 165.4 lb

## 2021-01-17 DIAGNOSIS — E7800 Pure hypercholesterolemia, unspecified: Secondary | ICD-10-CM

## 2021-01-17 DIAGNOSIS — R300 Dysuria: Secondary | ICD-10-CM

## 2021-01-17 DIAGNOSIS — Z0001 Encounter for general adult medical examination with abnormal findings: Secondary | ICD-10-CM

## 2021-01-17 DIAGNOSIS — E559 Vitamin D deficiency, unspecified: Secondary | ICD-10-CM

## 2021-01-17 NOTE — Progress Notes (Signed)
[facility] [place], [place] [address]  Internal MEDICINE  Office Visit Note  Patient Name: Abigail [name]  [id number]  [id number]  Date of Service: 01/17/2021  Chief Complaint  Patient presents with   Annual Exam    HPI Abigail [name] presents for an annual well visit and physical exam. She is a well-appearing 35 yo female. She has her labs done prior to her office visit. She still feels fatigued sometimes but it comes and goes. Her blood pressure is wnl. She denies any pain today other than a slight headache.  Labs were done to determine possible cause of fatigue. Her CBC was normal, no anemia noted. Her thyroid levels are wnl. B12 and folate and iron studies were all wnl. Her lipid panel was normal except for an elevated LDL of 113. Her vitamin D level was low at 20.8. She is not currently taking a vitamin D supplement.      Current Medication: No outpatient encounter medications on file as of 01/17/2021.   No facility-administered encounter medications on file as of 01/17/2021.    Surgical History: Past Surgical History:  Procedure Laterality Date   colonoscopy     EXTRACORPOREAL SHOCK WAVE LITHOTRIPSY Right 08/23/2017   Procedure: EXTRACORPOREAL SHOCK WAVE LITHOTRIPSY (ESWL);  Surgeon: Hollice Espy, MD;  Location: [facility] ORS;  Service: Urology;  Laterality: Right;    Medical History: Past Medical History:  Diagnosis Date   Abnormal Pap smear of cervix    Fetal demise > 22 weeks, delivered, current hospitalization 07/18/2017   nsvd at 26 weeks    Family History: Family History  Problem Relation Age of Onset   Diabetes Mother    Hypertension Mother    Diabetes Father    Hypertension Father     Social History   Socioeconomic History   Marital status: Married    Spouse name: Not on file   Number of children: Not on file   Years of education: Not on file   Highest education level: Not on file  Occupational History   Not on file  Tobacco Use    Smoking status: Never   Smokeless tobacco: Never  Vaping Use   Vaping Use: Never used  Substance and Sexual Activity   Alcohol use: No   Drug use: No   Sexual activity: Yes    Birth control/protection: I.U.D.  Other Topics Concern   Not on file  Social History Narrative   Not on file   Social Determinants of Health   Financial Resource Strain: Not on file  Food Insecurity: Not on file  Transportation Needs: Not on file  Physical Activity: Not on file  Stress: Not on file  Social Connections: Not on file  Intimate Partner Violence: Not on file      Review of Systems  Constitutional:  Negative for activity change, appetite change, chills, fatigue, fever and unexpected weight change.  HENT: Negative.  Negative for congestion, ear pain, rhinorrhea, sore throat and trouble swallowing.   Eyes: Negative.   Respiratory: Negative.  Negative for cough, chest tightness, shortness of breath and wheezing.   Cardiovascular: Negative.  Negative for chest pain.  Gastrointestinal: Negative.  Negative for abdominal pain, blood in stool, constipation, diarrhea, nausea and vomiting.  Endocrine: Negative.   Genitourinary: Negative.  Negative for difficulty urinating, dysuria, frequency, hematuria and urgency.  Musculoskeletal: Negative.  Negative for arthralgias, back pain, joint swelling, myalgias and neck pain.  Skin: Negative.  Negative for rash and wound.  Allergic/Immunologic: Negative.  Negative for immunocompromised state.  Neurological: Negative.  Negative for dizziness, seizures, numbness and headaches.  Hematological: Negative.   Psychiatric/Behavioral: Negative.  Negative for behavioral problems, self-injury and suicidal ideas. The patient is not nervous/anxious.    Vital Signs: BP 123/71   Pulse 82   Temp 98.6 F (37 C)   Resp 16   Ht 5\' 6"  (1.676 m)   Wt 165 lb 6.4 oz (75 kg)   SpO2 99%   BMI 26.70 kg/m    Physical Exam Vitals reviewed.  Constitutional:       General: She is awake. She is not in acute distress.    Appearance: Normal appearance. She is well-developed, well-groomed and overweight. She is not ill-appearing or diaphoretic.  HENT:     Head: Normocephalic and atraumatic.     Right Ear: Tympanic membrane, ear canal and external ear normal.     Left Ear: Tympanic membrane, ear canal and external ear normal.     Nose: Nose normal. No congestion or rhinorrhea.     Mouth/Throat:     Lips: Pink.     Mouth: Mucous membranes are moist.     Pharynx: Oropharynx is clear. Uvula midline. No oropharyngeal exudate or posterior oropharyngeal erythema.  Eyes:     General: Lids are normal. Vision grossly intact. Gaze aligned appropriately. No scleral icterus.       Right eye: No discharge.        Left eye: No discharge.     Extraocular Movements: Extraocular movements intact.     Conjunctiva/sclera: Conjunctivae normal.     Pupils: Pupils are equal, round, and reactive to light.     Funduscopic exam:    Right eye: Red reflex present.        Left eye: Red reflex present. Neck:     Thyroid: No thyromegaly.     Vascular: No JVD.     Trachea: Trachea and phonation normal. No tracheal deviation.  Cardiovascular:     Rate and Rhythm: Normal rate and regular rhythm.     Pulses: Normal pulses.     Heart sounds: Normal heart sounds, S1 normal and S2 normal. No murmur heard.   No friction rub. No gallop.  Pulmonary:     Effort: Pulmonary effort is normal. No accessory muscle usage or respiratory distress.     Breath sounds: Normal breath sounds and air entry. No stridor. No wheezing or rales.  Chest:     Chest wall: No tenderness.     Comments: Declined breast exam, was recently done at her OBGYN office. Abdominal:     General: Bowel sounds are normal. There is no distension.     Palpations: Abdomen is soft. There is no shifting dullness, fluid wave, mass or pulsatile mass.     Tenderness: There is no abdominal tenderness. There is no guarding or  rebound.  Musculoskeletal:        General: No tenderness or deformity. Normal range of motion.     Cervical back: Normal range of motion and neck supple.  Lymphadenopathy:     Cervical: No cervical adenopathy.  Skin:    General: Skin is warm and dry.     Capillary Refill: Capillary refill takes less than 2 seconds.     Coloration: Skin is not pale.     Findings: No erythema or rash.  Neurological:     Mental Status: She is alert and oriented to person, place, and time.     Cranial Nerves: No cranial nerve  deficit.     Motor: No abnormal muscle tone.     Coordination: Coordination normal.     Gait: Gait normal.     Deep Tendon Reflexes: Reflexes are normal and symmetric.  Psychiatric:        Mood and Affect: Mood and affect normal.        Behavior: Behavior normal. Behavior is cooperative.        Thought Content: Thought content normal.        Judgment: Judgment normal.       Assessment/Plan: 1. Encounter for routine adult health examination with abnormal findings Age-appropriate preventive screenings and vaccinations discussed, annual physical exam completed. Routine labs for health maintenance done prior to office visit and discussed with patient today. PHM updated.   2. Elevated low density lipoprotein (LDL) cholesterol level LDL elevated at 113. Discussed decreasing red meat in diet and increasing leaner proteins such as chicken, [place] and fish. Encouraged patient to take OTC fish oil supplement 1000 mg daily.   3. Vitamin D deficiency Low vitamin D level - recommended patient take OTC vitamin D supplement of 5000-10000 units daily. Will recheck vitamin D in 6 months to a year.   4. Dysuria Routine urinalysis done - UA/M w/rflx Culture, Routine      General Counseling: Latresha verbalizes understanding of the findings of todays visit and agrees with plan of treatment. I have discussed any further diagnostic evaluation that may be needed or ordered today. We also  reviewed her medications today. she has been encouraged to call the office with any questions or concerns that should arise related to todays visit.    Orders Placed This Encounter  Procedures   UA/M w/rflx Culture, Routine    No orders of the defined types were placed in this encounter.   Return in about 1 year (around 01/17/2022) for CPE, Razia Screws PCP.   Total time spent:30 Minutes Time spent includes review of chart, medications, test results, and follow up plan with the patient.   [place] Controlled Substance Database was reviewed by me.  This patient was seen by Sallyanne Kuster, FNP-C in collaboration with Dr. Beverely Risen as a part of collaborative care agreement.  Malaia Buchta R. Tedd Sias, MSN, FNP-C Internal medicine

## 2021-01-18 LAB — UA/M W/RFLX CULTURE, ROUTINE
Bilirubin, UA: NEGATIVE
Glucose, UA: NEGATIVE
Ketones, UA: NEGATIVE
Leukocytes,UA: NEGATIVE
Nitrite, UA: NEGATIVE
Protein,UA: NEGATIVE
RBC, UA: NEGATIVE
Specific Gravity, UA: 1.02 (ref 1.005–1.030)
Urobilinogen, Ur: 1 mg/dL (ref 0.2–1.0)
pH, UA: 7 (ref 5.0–7.5)

## 2021-01-18 LAB — MICROSCOPIC EXAMINATION
Bacteria, UA: NONE SEEN
Casts: NONE SEEN /lpf
RBC, Urine: NONE SEEN /hpf (ref 0–2)

## 2021-01-24 ENCOUNTER — Ambulatory Visit: Payer: 59 | Admitting: Physician Assistant

## 2021-02-12 ENCOUNTER — Encounter: Payer: Self-pay | Admitting: Nurse Practitioner

## 2021-07-31 IMAGING — MR MR HEAD WO/W CM
15 series · 48 of 48 positions shown · IV contrast (gadavist)
Comparison: Head CT 08/06/2006

CLINICAL DATA: Pressure, worsened headache, diffusely for 1 month.
Symptoms worse when tilting head down

EXAM:
MRI HEAD WITHOUT AND WITH CONTRAST
TECHNIQUE: Multiplanar, multiecho pulse sequences of the brain and surrounding
structures were obtained without and with intravenous contrast.
CONTRAST:  7mL GADAVIST GADOBUTROL 1 MMOL/ML IV SOLN

[Series 5: ax dwi_tracew · axial · 3.0mm · 0.60mm/px · z∈[-120,+32]mm · 4 of 48 slices shown]
[im 1/48]
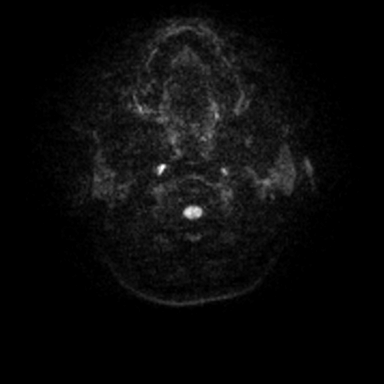
[im 16/48]
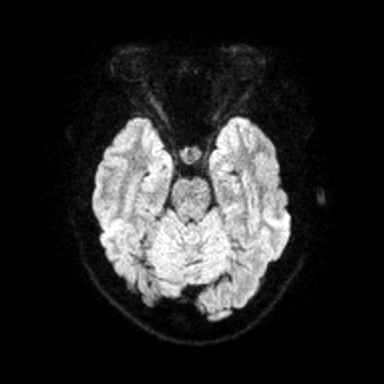
[im 32/48]
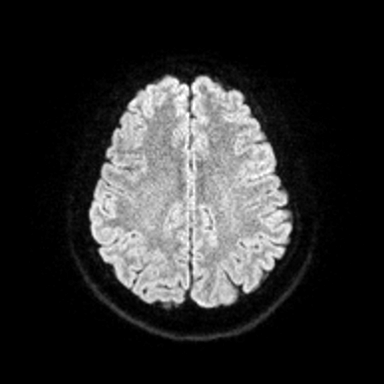
[im 48/48]
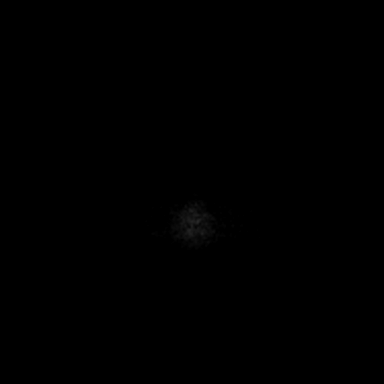

[Series 6: ax dwi_adc · axial · 3.0mm · 0.60mm/px · z∈[-120,+28]mm · 3 of 47 slices shown]
[im 1/47]
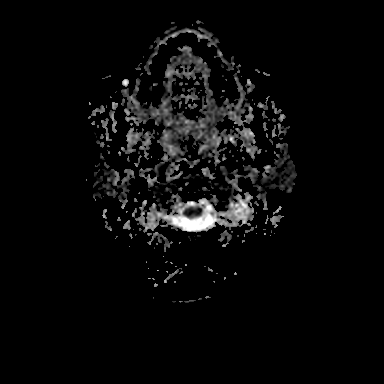
[im 24/47]
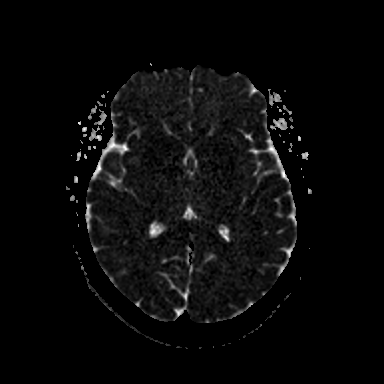
[im 47/47]
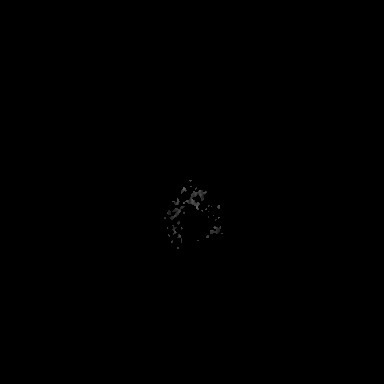

[Series 7: cor dwi_tracew · coronal · 5.0mm · 0.60mm/px · 2 of 40 slices shown]
[im 1/40]
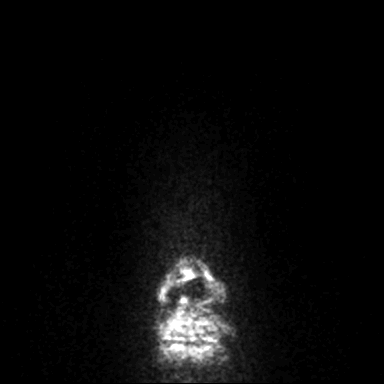
[im 40/40]
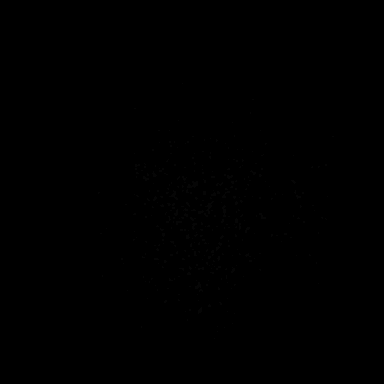

[Series 8: cor dwi_adc · coronal · 5.0mm · 0.60mm/px · 2 of 37 slices shown]
[im 1/37]
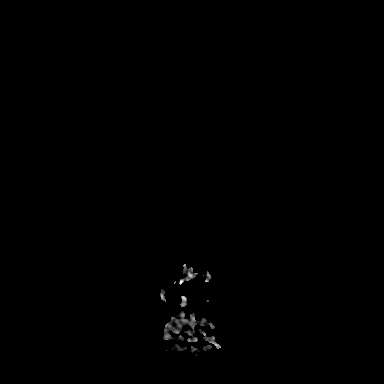
[im 37/37]
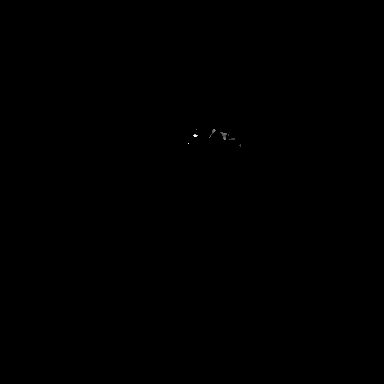

[Series 9: T1 · sagittal · 5.0mm · 0.62mm/px · 1 of 25 slices shown (1 of 2)]
[im 1/25]
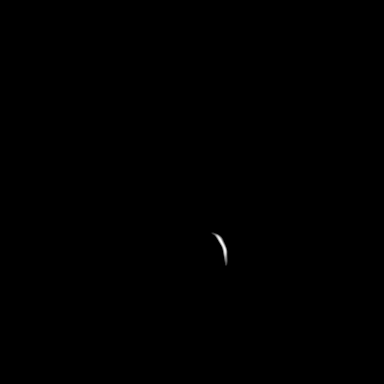

[Series 10: FLAIR · sagittal · 5.0mm · 0.94mm/px · 1 of 25 slices shown (1 of 2)]
[im 1/25]
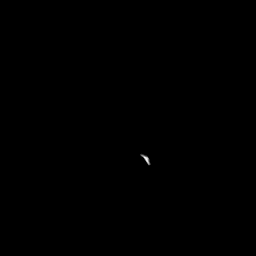

[Series 11: T2 · axial · 5.0mm · 0.53mm/px · 1 of 25 slices shown]
[im 1/25]
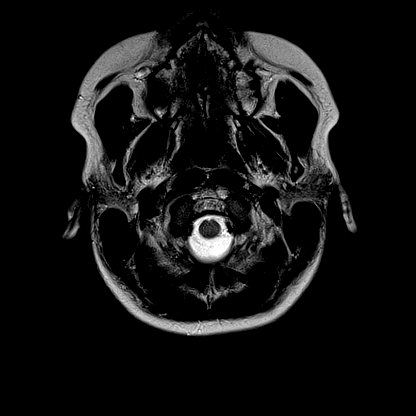

[Series 13: pha_images · axial · 3.0mm · 0.90mm/px · z∈[-130,+43]mm · 3 of 60 slices shown]
[im 1/60]
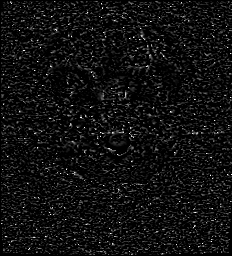
[im 30/60]
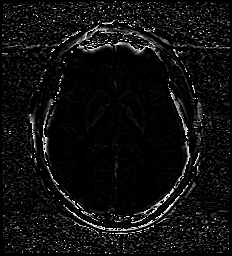
[im 60/60]
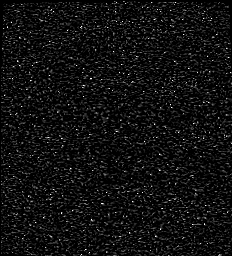

[Series 14: swi_images · axial · 3.0mm · 0.90mm/px · z∈[-130,+43]mm · 3 of 60 slices shown]
[im 1/60]
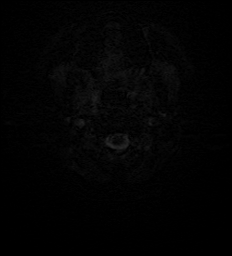
[im 30/60]
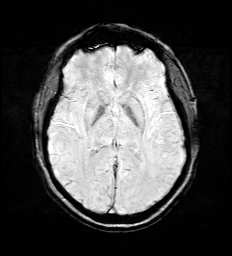
[im 60/60]
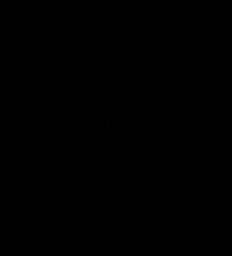

[Series 16: FLAIR · axial · 3.0mm · 0.53mm/px · z∈[-122,+36]mm · 3 of 55 slices shown (2 of 2)]
[im 1/55]
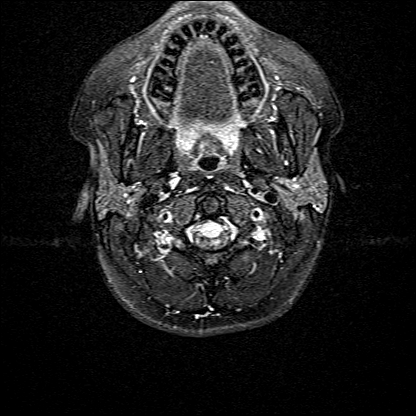
[im 28/55]
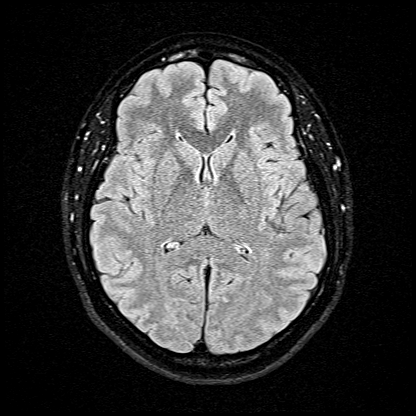
[im 55/55]
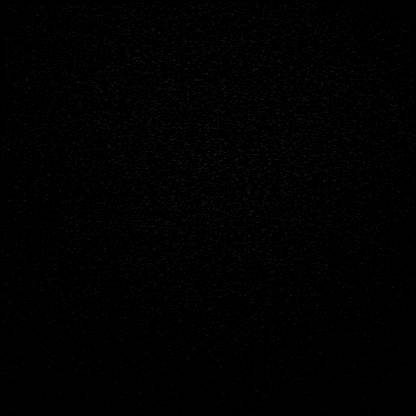

[Series 17: T1 · axial · 1.0mm · 0.98mm/px · z∈[-132,+39]mm · 10 of 175 slices shown (2 of 2)]
[im 1/175]
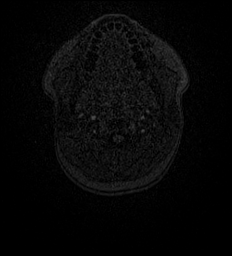
[im 20/175]
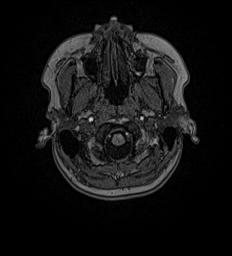
[im 39/175]
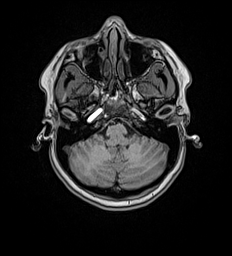
[im 59/175]
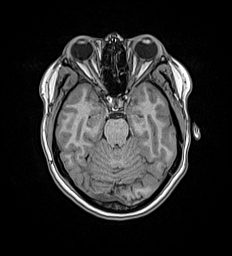
[im 78/175]
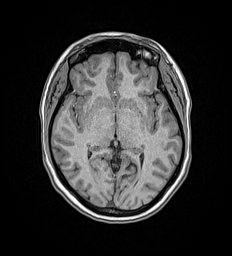
[im 97/175]
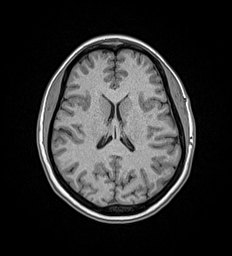
[im 117/175]
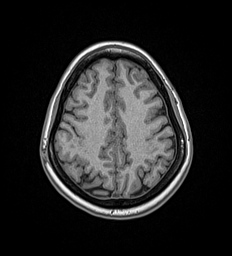
[im 136/175]
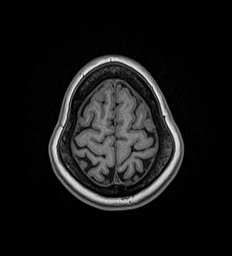
[im 155/175]
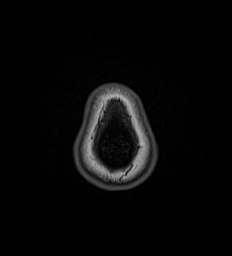
[im 175/175]
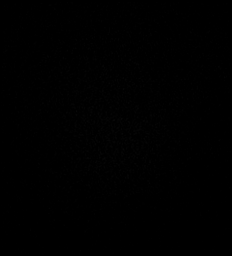

[Series 18: T2 post-contrast · coronal · 5.0mm · 0.57mm/px · 2 of 29 slices shown]
[im 1/29]
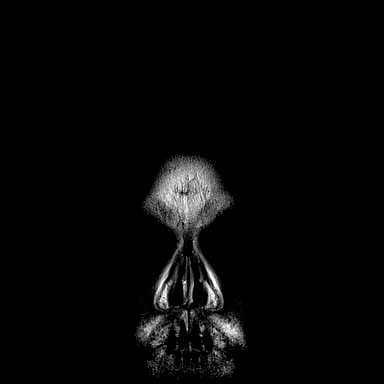
[im 29/29]
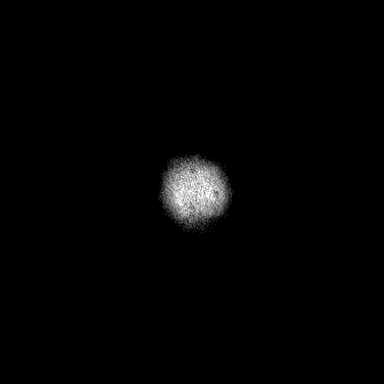

[Series 19: T1 post-contrast · axial · 1.0mm · 0.98mm/px · z∈[-132,+39]mm · 10 of 176 slices shown (1 of 3)]
[im 1/176]
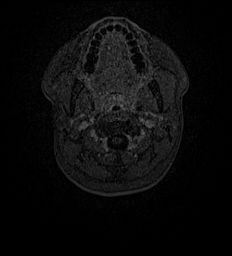
[im 20/176]
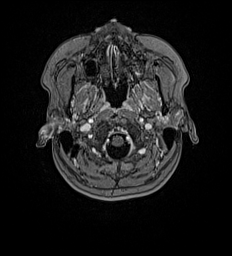
[im 39/176]
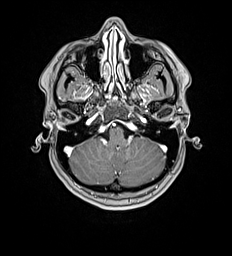
[im 59/176]
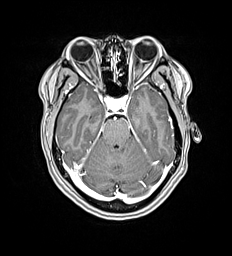
[im 78/176]
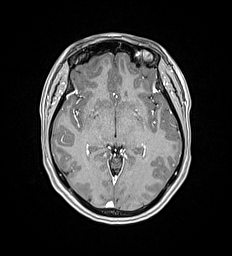
[im 98/176]
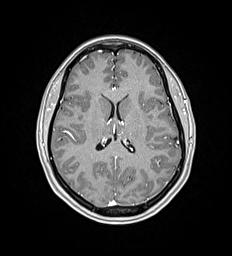
[im 117/176]
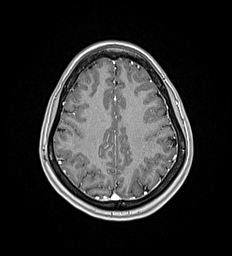
[im 137/176]
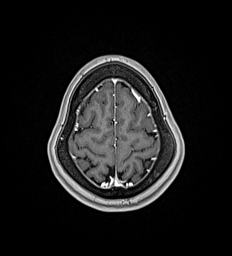
[im 156/176]
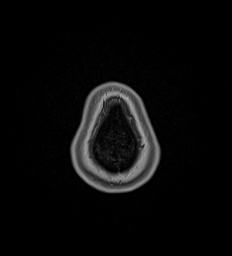
[im 176/176]
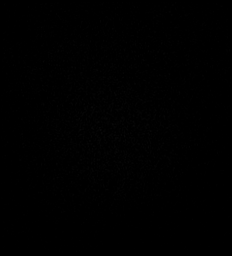

[Series 20: T1 post-contrast · coronal · 5.0mm · 0.57mm/px · 2 of 29 slices shown (2 of 3)]
[im 1/29]
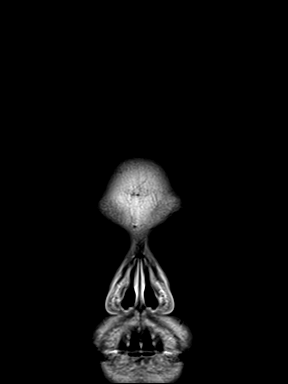
[im 29/29]
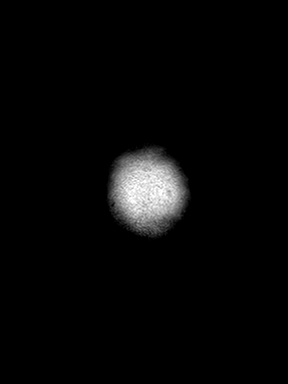

[Series 21: T1 post-contrast · sagittal · 5.0mm · 0.62mm/px · 1 of 25 slices shown (3 of 3)]
[im 1/25]
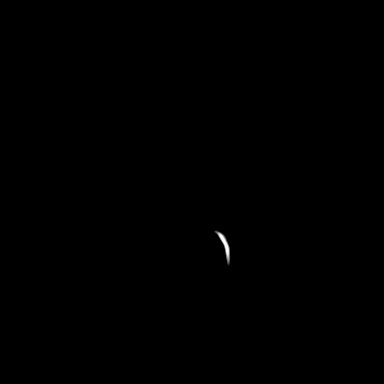

[48 of 48 positions shown; findings below may reference images not displayed]

FINDINGS: Brain: No acute infarction, hemorrhage, hydrocephalus, extra-axial
collection or mass lesion. No abnormal intracranial enhancement. No
white matter disease or atrophy. No signs of brain sagging or
elevated intracranial pressure.

Vascular: Normal flow voids.

Skull and upper cervical spine: Normal marrow signal.

Sinuses/Orbits: Negative.
IMPRESSION: Negative brain MRI.

## 2022-01-04 ENCOUNTER — Encounter: Payer: 59 | Admitting: Nurse Practitioner

## 2022-01-19 ENCOUNTER — Encounter: Payer: Self-pay | Admitting: Nurse Practitioner

## 2022-01-19 ENCOUNTER — Ambulatory Visit (INDEPENDENT_AMBULATORY_CARE_PROVIDER_SITE_OTHER): Payer: 59 | Admitting: Nurse Practitioner

## 2022-01-19 VITALS — BP 106/60 | HR 82 | Temp 97.2°F | Resp 16 | Ht 66.0 in | Wt 161.6 lb

## 2022-01-19 DIAGNOSIS — E7800 Pure hypercholesterolemia, unspecified: Secondary | ICD-10-CM | POA: Diagnosis not present

## 2022-01-19 DIAGNOSIS — R5383 Other fatigue: Secondary | ICD-10-CM

## 2022-01-19 DIAGNOSIS — Z0001 Encounter for general adult medical examination with abnormal findings: Secondary | ICD-10-CM | POA: Diagnosis not present

## 2022-01-19 DIAGNOSIS — R300 Dysuria: Secondary | ICD-10-CM

## 2022-01-19 DIAGNOSIS — E559 Vitamin D deficiency, unspecified: Secondary | ICD-10-CM | POA: Diagnosis not present

## 2022-01-19 NOTE — Progress Notes (Signed)
[facility] [place], [place] [address]  Internal MEDICINE  Office Visit Note  Patient Name: Azadeh Hyder  [id number]  [id number]  Date of Service: 01/19/2022  Chief Complaint  Patient presents with   Annual Exam    HPI Martha presents for an annual well visit and physical exam. well-appearing 36 year old female with no significant medical problems and not current taking any medications. Has some fatigue that comes and goes.  Due for routine labs No preventive screenings due  Pap smear due in 2025 BP and other vital signs are normal.  No new or worsening pain    Current Medication: No outpatient encounter medications on file as of 01/19/2022.   No facility-administered encounter medications on file as of 01/19/2022.    Surgical History: Past Surgical History:  Procedure Laterality Date   colonoscopy     EXTRACORPOREAL SHOCK WAVE LITHOTRIPSY Right 08/23/2017   Procedure: EXTRACORPOREAL SHOCK WAVE LITHOTRIPSY (ESWL);  Surgeon: Hollice Espy, MD;  Location: [facility] ORS;  Service: Urology;  Laterality: Right;    Medical History: Past Medical History:  Diagnosis Date   Abnormal Pap smear of cervix    Fetal demise > 22 weeks, delivered, current hospitalization 07/18/2017   nsvd at 26 weeks    Family History: Family History  Problem Relation Age of Onset   Diabetes Mother    Hypertension Mother    Diabetes Father    Hypertension Father     Social History   Socioeconomic History   Marital status: Married    Spouse name: Not on file   Number of children: Not on file   Years of education: Not on file   Highest education level: Not on file  Occupational History   Not on file  Tobacco Use   Smoking status: Never   Smokeless tobacco: Never  Vaping Use   Vaping Use: Never used  Substance and Sexual Activity   Alcohol use: No   Drug use: No   Sexual activity: Yes    Birth control/protection: I.U.D.  Other Topics Concern   Not on file   Social History Narrative   Not on file   Social Determinants of Health   Financial Resource Strain: Not on file  Food Insecurity: Not on file  Transportation Needs: Not on file  Physical Activity: Not on file  Stress: Not on file  Social Connections: Not on file  Intimate Partner Violence: Not on file      Review of Systems  Constitutional:  Negative for activity change, appetite change, chills, fatigue, fever and unexpected weight change.  HENT: Negative.  Negative for congestion, ear pain, rhinorrhea, sore throat and trouble swallowing.   Eyes: Negative.   Respiratory: Negative.  Negative for cough, chest tightness, shortness of breath and wheezing.   Cardiovascular: Negative.  Negative for chest pain.  Gastrointestinal: Negative.  Negative for abdominal pain, blood in stool, constipation, diarrhea, nausea and vomiting.  Endocrine: Negative.   Genitourinary: Negative.  Negative for difficulty urinating, dysuria, frequency, hematuria and urgency.  Musculoskeletal: Negative.  Negative for arthralgias, back pain, joint swelling, myalgias and neck pain.  Skin: Negative.  Negative for rash and wound.  Allergic/Immunologic: Negative.  Negative for immunocompromised state.  Neurological: Negative.  Negative for dizziness, seizures, numbness and headaches.  Hematological: Negative.   Psychiatric/Behavioral: Negative.  Negative for behavioral problems, self-injury and suicidal ideas. The patient is not nervous/anxious.     Vital Signs: BP 106/60   Pulse 82   Temp Marland Kitchen)  97.2 F (36.2 C)   Resp 16   Ht _0  (1.676 m)   Wt 161 lb 9.6 oz (73.3 kg)   SpO2 98%   BMI 26.08 kg/m    Physical Exam Vitals reviewed.  Constitutional:      General: She is awake. She is not in acute distress.    Appearance: Normal appearance. She is well-developed, well-groomed and overweight. She is not ill-appearing or diaphoretic.  HENT:     Head: Normocephalic and atraumatic.     Right Ear:  Tympanic membrane, ear canal and external ear normal.     Left Ear: Tympanic membrane, ear canal and external ear normal.     Nose: Nose normal. No congestion or rhinorrhea.     Mouth/Throat:     Lips: Pink.     Mouth: Mucous membranes are moist.     Pharynx: Oropharynx is clear. Uvula midline. No oropharyngeal exudate or posterior oropharyngeal erythema.  Eyes:     General: Lids are normal. Vision grossly intact. Gaze aligned appropriately. No scleral icterus.       Right eye: No discharge.        Left eye: No discharge.     Extraocular Movements: Extraocular movements intact.     Conjunctiva/sclera: Conjunctivae normal.     Pupils: Pupils are equal, round, and reactive to light.     Funduscopic exam:    Right eye: Red reflex present.        Left eye: Red reflex present. Neck:     Thyroid: No thyromegaly.     Vascular: No JVD.     Trachea: Trachea and phonation normal. No tracheal deviation.  Cardiovascular:     Rate and Rhythm: Normal rate and regular rhythm.     Pulses: Normal pulses.     Heart sounds: Normal heart sounds, S1 normal and S2 normal. No murmur heard.    No friction rub. No gallop.  Pulmonary:     Effort: Pulmonary effort is normal. No accessory muscle usage or respiratory distress.     Breath sounds: Normal breath sounds and air entry. No stridor. No wheezing or rales.  Chest:     Chest wall: No tenderness.     Comments: Declined breast exam, was recently done at her OBGYN office. Abdominal:     General: Bowel sounds are normal. There is no distension.     Palpations: Abdomen is soft. There is no shifting dullness, fluid wave, mass or pulsatile mass.     Tenderness: There is no abdominal tenderness. There is no guarding or rebound.  Musculoskeletal:        General: No tenderness or deformity. Normal range of motion.     Cervical back: Normal range of motion and neck supple.  Lymphadenopathy:     Cervical: No cervical adenopathy.  Skin:    General: Skin is  warm and dry.     Capillary Refill: Capillary refill takes less than 2 seconds.     Coloration: Skin is not pale.     Findings: No erythema or rash.  Neurological:     Mental Status: She is alert and oriented to person, place, and time.     Cranial Nerves: No cranial nerve deficit.     Motor: No abnormal muscle tone.     Coordination: Coordination normal.     Gait: Gait normal.     Deep Tendon Reflexes: Reflexes are normal and symmetric.  Psychiatric:        Mood and Affect: Mood  and affect normal.        Behavior: Behavior normal. Behavior is cooperative.        Thought Content: Thought content normal.        Judgment: Judgment normal.        Assessment/Plan: 1. Encounter for routine adult health examination with abnormal findings Age-appropriate preventive screenings and vaccinations discussed, annual physical exam completed. Routine labs for health maintenance ordered, see below . PHM updated.  - CBC with Differential/Platelet - CMP14+EGFR - Lipid Profile - Vitamin D (25 hydroxy)  2. Other fatigue Still having intermittent fatigue, routine labs ordered - CBC with Differential/Platelet - CMP14+EGFR  3. Elevated low density lipoprotein (LDL) cholesterol level Routine labs ordered - CBC with Differential/Platelet - CMP14+EGFR - Lipid Profile  4. Vitamin D deficiency Routine lab ordered - Vitamin D (25 hydroxy)  5. Dysuria Routine urinalysis done - Urinalysis, Routine w reflex microscopic     General Counseling: Tatia verbalizes understanding of the findings of todays visit and agrees with plan of treatment. I have discussed any further diagnostic evaluation that may be needed or ordered today. We also reviewed her medications today. she has been encouraged to call the office with any questions or concerns that should arise related to todays visit.    Orders Placed This Encounter  Procedures   Urinalysis, Routine w reflex microscopic   CBC with  Differential/Platelet   CMP14+EGFR   Lipid Profile   Vitamin D (25 hydroxy)    No orders of the defined types were placed in this encounter.   Return in about 1 year (around 01/20/2023) for CPE, [place] PCP.   Total time spent:30 Minutes Time spent includes review of chart, medications, test results, and follow up plan with the patient.   [place] Controlled Substance Database was reviewed by me.  This patient was seen by Jonetta Osgood, FNP-C in collaboration with Dr. Clayborn Bigness as a part of collaborative care agreement.  Lucilla Petrenko R. Valetta Fuller, MSN, FNP-C Internal medicine

## 2022-01-20 LAB — URINALYSIS, ROUTINE W REFLEX MICROSCOPIC
Bilirubin, UA: NEGATIVE
Glucose, UA: NEGATIVE
Ketones, UA: NEGATIVE
Leukocytes,UA: NEGATIVE
Nitrite, UA: NEGATIVE
Protein,UA: NEGATIVE
RBC, UA: NEGATIVE
Specific Gravity, UA: 1.02 (ref 1.005–1.030)
Urobilinogen, Ur: 1 mg/dL (ref 0.2–1.0)
pH, UA: 7 (ref 5.0–7.5)

## 2022-01-21 ENCOUNTER — Encounter: Payer: Self-pay | Admitting: Nurse Practitioner

## 2022-01-27 LAB — VITAMIN D 25 HYDROXY (VIT D DEFICIENCY, FRACTURES): Vit D, 25-Hydroxy: 32.1 ng/mL (ref 30.0–100.0)

## 2022-01-27 LAB — CBC WITH DIFFERENTIAL/PLATELET
Basophils Absolute: 0.1 10*3/uL (ref 0.0–0.2)
Basos: 1 %
EOS (ABSOLUTE): 0.2 10*3/uL (ref 0.0–0.4)
Eos: 3 %
Hematocrit: 38.7 % (ref 34.0–46.6)
Hemoglobin: 12.7 g/dL (ref 11.1–15.9)
Immature Grans (Abs): 0 10*3/uL (ref 0.0–0.1)
Immature Granulocytes: 0 %
Lymphocytes Absolute: 2.3 10*3/uL (ref 0.7–3.1)
Lymphs: 34 %
MCH: 28.6 pg (ref 26.6–33.0)
MCHC: 32.8 g/dL (ref 31.5–35.7)
MCV: 87 fL (ref 79–97)
Monocytes Absolute: 0.4 10*3/uL (ref 0.1–0.9)
Monocytes: 6 %
Neutrophils Absolute: 3.7 10*3/uL (ref 1.4–7.0)
Neutrophils: 56 %
Platelets: 328 10*3/uL (ref 150–450)
RBC: 4.44 x10E6/uL (ref 3.77–5.28)
RDW: 12 % (ref 11.7–15.4)
WBC: 6.7 10*3/uL (ref 3.4–10.8)

## 2022-01-27 LAB — CMP14+EGFR
ALT: 17 IU/L (ref 0–32)
AST: 16 IU/L (ref 0–40)
Albumin/Globulin Ratio: 1.8 (ref 1.2–2.2)
Albumin: 4.6 g/dL (ref 3.9–4.9)
Alkaline Phosphatase: 69 IU/L (ref 44–121)
BUN/Creatinine Ratio: 11 (ref 9–23)
BUN: 11 mg/dL (ref 6–20)
Bilirubin Total: 0.6 mg/dL (ref 0.0–1.2)
CO2: 22 mmol/L (ref 20–29)
Calcium: 9.2 mg/dL (ref 8.7–10.2)
Chloride: 102 mmol/L (ref 96–106)
Creatinine, Ser: 0.98 mg/dL (ref 0.57–1.00)
Globulin, Total: 2.5 g/dL (ref 1.5–4.5)
Glucose: 99 mg/dL (ref 70–99)
Potassium: 4.3 mmol/L (ref 3.5–5.2)
Sodium: 140 mmol/L (ref 134–144)
Total Protein: 7.1 g/dL (ref 6.0–8.5)
eGFR: 77 mL/min/{1.73_m2} (ref >59)

## 2022-01-27 LAB — LIPID PANEL
Chol/HDL Ratio: 3.8 ratio (ref 0.0–4.4)
Cholesterol, Total: 161 mg/dL (ref 100–199)
HDL: 42 mg/dL (ref >39)
LDL Chol Calc (NIH): 106 mg/dL — ABNORMAL HIGH (ref 0–99)
Triglycerides: 65 mg/dL (ref 0–149)
VLDL Cholesterol Cal: 13 mg/dL (ref 5–40)

## 2022-03-29 ENCOUNTER — Telehealth: Payer: Self-pay

## 2022-03-29 NOTE — Progress Notes (Signed)
Call patient please All labs are normal except LDL on cholesterol panel but this has decreased by 7 to 106, continue current diet and lifestyle modifications, number should continue to improve to normal.

## 2022-03-29 NOTE — Telephone Encounter (Signed)
Left message for patient to give office a call back.  

## 2022-10-15 ENCOUNTER — Other Ambulatory Visit: Payer: Self-pay

## 2022-10-15 ENCOUNTER — Emergency Department
Admission: EM | Admit: 2022-10-15 | Discharge: 2022-10-15 | Disposition: A | Discharge status: Home / Self Care | Payer: 59 | Attending: Emergency Medicine | Admitting: Emergency Medicine

## 2022-10-15 ENCOUNTER — Emergency Department: Payer: 59

## 2022-10-15 DIAGNOSIS — N939 Abnormal uterine and vaginal bleeding, unspecified: Secondary | ICD-10-CM | POA: Diagnosis present

## 2022-10-15 DIAGNOSIS — O200 Threatened abortion: Secondary | ICD-10-CM

## 2022-10-15 DIAGNOSIS — O208 Other hemorrhage in early pregnancy: Secondary | ICD-10-CM

## 2022-10-15 LAB — COMPREHENSIVE METABOLIC PANEL
ALT: 18 U/L (ref 0–44)
AST: 14 U/L — ABNORMAL LOW (ref 15–41)
Albumin: 4 g/dL (ref 3.5–5.0)
Alkaline Phosphatase: 45 U/L (ref 38–126)
Anion gap: 7 (ref 5–15)
BUN: 16 mg/dL (ref 6–20)
CO2: 25 mmol/L (ref 22–32)
Calcium: 9.2 mg/dL (ref 8.9–10.3)
Chloride: 105 mmol/L (ref 98–111)
Creatinine, Ser: 0.86 mg/dL (ref 0.44–1.00)
GFR, Estimated: >60 mL/min (ref >60)
Glucose, Bld: 129 mg/dL — ABNORMAL HIGH (ref 70–99)
Potassium: 3.8 mmol/L (ref 3.5–5.1)
Sodium: 137 mmol/L (ref 135–145)
Total Bilirubin: 0.4 mg/dL (ref 0.3–1.2)
Total Protein: 7.1 g/dL (ref 6.5–8.1)

## 2022-10-15 LAB — WET PREP, GENITAL
Clue Cells Wet Prep HPF POC: NONE SEEN
Sperm: NONE SEEN
Trich, Wet Prep: NONE SEEN
WBC, Wet Prep HPF POC: <10 — AB (ref <10)

## 2022-10-15 LAB — CBC
HCT: 37.7 % (ref 36.0–46.0)
Hemoglobin: 12.1 g/dL (ref 12.0–15.0)
MCH: 28 pg (ref 26.0–34.0)
MCHC: 32.1 g/dL (ref 30.0–36.0)
MCV: 87.3 fL (ref 80.0–100.0)
Platelets: 307 10*3/uL (ref 150–400)
RBC: 4.32 MIL/uL (ref 3.87–5.11)
RDW: 12.6 % (ref 11.5–15.5)
WBC: 11.1 10*3/uL — ABNORMAL HIGH (ref 4.0–10.5)
nRBC: 0 % (ref 0.0–0.2)

## 2022-10-15 LAB — URINALYSIS, W/ REFLEX TO CULTURE (INFECTION SUSPECTED)
Bacteria, UA: NONE SEEN
Bilirubin Urine: NEGATIVE
Glucose, UA: NEGATIVE mg/dL
Ketones, ur: 5 mg/dL — AB
Leukocytes,Ua: NEGATIVE
Nitrite: NEGATIVE
Protein, ur: 30 mg/dL — AB
Specific Gravity, Urine: 1.035 — ABNORMAL HIGH (ref 1.005–1.030)
pH: 5 (ref 5.0–8.0)

## 2022-10-15 LAB — POC URINE PREG, ED: Preg Test, Ur: POSITIVE — AB

## 2022-10-15 LAB — HCG, QUANTITATIVE, PREGNANCY: hCG, Beta Chain, Quant, S: 154735 m[IU]/mL — ABNORMAL HIGH (ref <5)

## 2022-10-15 LAB — ABO/RH: ABO/RH(D): O POS

## 2022-10-15 NOTE — Discharge Instructions (Addendum)
You are seen in the emergency department for vaginal bleeding.  You had an ultrasound done that showed a good fetal heart rate, you are at 9 weeks.  You did have a subchorionic hemorrhage which is likely the cause of your bleeding.  The rest of your lab work was normal.  Your blood type is O+.  Return to the emergency department for worsening bleeding like we discussed.  Stay hydrated.  Follow-up with your obstetrician on Tuesday.

## 2022-10-15 NOTE — ED Provider Notes (Signed)
[facility] Provider Note    Event Date/Time   First MD Initiated Contact with Patient 10/15/22 1553     (approximate)   History   Vaginal Bleeding   HPI  Abigail [name] is a 37 y.o. female G4 P2 with 1 prior miscarriage who presents to the emergency department with vaginal bleeding.  Patient states that she had some mild pressure yesterday and then today noted vaginal bleeding.  Less than a menstrual cycle.  Did note 1 blood clot.  No tissue products.  Denies any abdominal pain at this time.  Denies any nausea or vomiting.  No dysuria.  Has not yet received an ultrasound this pregnancy.  Has a follow-up appointment with obstetrician in 2 days.  1 prior miscarriage.     Physical Exam   Triage Vital Signs: ED Triage Vitals [10/15/22 1504]  Encounter Vitals Group     BP 137/81     Systolic BP Percentile      Diastolic BP Percentile      Pulse Rate 91     Resp 15     Temp 98.2 F (36.8 C)     Temp src      SpO2 99 %     Weight 160 lb (72.6 kg)     Height 5\' 6"  (1.676 m)     Head Circumference      Peak Flow      Pain Score 0     Pain Loc      Pain Education      Exclude from Growth Chart     Most recent vital signs: Vitals:   10/15/22 1504 10/15/22 2001  BP: 137/81 111/70  Pulse: 91 82  Resp: 15 18  Temp: 98.2 F (36.8 C) 98.9 F (37.2 C)  SpO2: 99% 98%    Physical Exam Constitutional:      Appearance: She is well-developed.  HENT:     Head: Atraumatic.  Eyes:     Conjunctiva/sclera: Conjunctivae normal.  Cardiovascular:     Rate and Rhythm: Regular rhythm.  Pulmonary:     Effort: No respiratory distress.  Abdominal:     General: There is no distension.  Genitourinary:    Vagina: Bleeding present.     Cervix: Normal. No cervical motion tenderness or friability.     Adnexa: Right adnexa normal and left adnexa normal.     Comments: Closed cervical os Musculoskeletal:        General: Normal range of motion.     Cervical  back: Normal range of motion.  Skin:    General: Skin is warm.  Neurological:     Mental Status: She is alert. Mental status is at baseline.     IMPRESSION / MDM / ASSESSMENT AND PLAN / ED COURSE  I reviewed the triage vital signs and the nursing notes.  Differential diagnosis including miscarriage, ectopic pregnancy, threatened miscarriage .  RADIOLOGY my interpretation of imaging: IUP with good fetal heartbeat.  Read as heart rate of 182.  9 weeks 0 days.  Small subchorionic hemorrhage  LABS (all labs ordered are listed, but only abnormal results are displayed) Labs interpreted as -    Labs Reviewed  WET PREP, GENITAL - Abnormal; Notable for the following components:      Result Value   Yeast Wet Prep HPF POC PRESENT (*)    WBC, Wet Prep HPF POC <10 (*)    All other components within normal limits  CBC - Abnormal; Notable  for the following components:   WBC 11.1 (*)    All other components within normal limits  COMPREHENSIVE METABOLIC PANEL - Abnormal; Notable for the following components:   Glucose, Bld 129 (*)    AST 14 (*)    All other components within normal limits  HCG, QUANTITATIVE, PREGNANCY - Abnormal; Notable for the following components:   hCG, Beta Chain, Quant, S 154,735 (*)    All other components within normal limits  URINALYSIS, W/ REFLEX TO CULTURE (INFECTION SUSPECTED) - Abnormal; Notable for the following components:   Color, Urine YELLOW (*)    APPearance HAZY (*)    Specific Gravity, Urine 1.035 (*)    Hgb urine dipstick SMALL (*)    Ketones, ur 5 (*)    Protein, ur 30 (*)    All other components within normal limits  POC URINE PREG, ED - Abnormal; Notable for the following components:   Preg Test, Ur Positive (*)    All other components within normal limits  ABO/RH     MDM  Patient has a closed cervical os.  Clinical picture is consistent with threatened miscarriage.  Does have a small subchorionic hemorrhage.  No active ongoing bleeding  at this time.  Hemoglobin is stable.  No signs of clue cells or concern for bacterial vaginosis.  Blood type is O+.  Discussed return precautions for any worsening bleeding.  Discussed follow-up with her obstetrician on Tuesday.     PROCEDURES:  Critical Care performed: No  Procedures  Patient's presentation is most consistent with acute presentation with potential threat to life or bodily function.   MEDICATIONS ORDERED IN ED: Medications - No data to display  FINAL CLINICAL IMPRESSION(S) / ED DIAGNOSES   Final diagnoses:  Threatened miscarriage  Subchorionic hemorrhage of placenta in first trimester     Rx / DC Orders   ED Discharge Orders     None        Note:  This document was prepared using Dragon voice recognition software and may include unintentional dictation errors.   Corena Herter, MD 10/15/22 2003

## 2022-10-15 NOTE — ED Triage Notes (Signed)
Pt states coming in with vaginal bleeding that started about 1 hour ago. Pt states it is more than just spotting. Pt states she is approximately 4 months pregnant and his is her 4th pregnancy. Pt endorses some abdominal cramping as well. Pt states she has not been seen by OB yet, but has an appointment coming up.

## 2022-11-06 LAB — PANORAMA PRENATAL TEST FULL PANEL:PANORAMA TEST PLUS 5 ADDITIONAL MICRODELETIONS: FETAL FRACTION: 8.1

## 2023-01-18 ENCOUNTER — Telehealth: Payer: Self-pay | Admitting: Nurse Practitioner

## 2023-01-18 NOTE — Telephone Encounter (Signed)
Left vm to confirm 01/25/23 appointment-Toni

## 2023-01-25 ENCOUNTER — Encounter (INDEPENDENT_AMBULATORY_CARE_PROVIDER_SITE_OTHER): Payer: 59 | Admitting: Nurse Practitioner

## 2023-01-25 ENCOUNTER — Encounter: Payer: Self-pay | Admitting: Nurse Practitioner

## 2023-03-27 ENCOUNTER — Telehealth: Payer: Self-pay | Admitting: Nurse Practitioner

## 2023-03-27 NOTE — Telephone Encounter (Signed)
 Lvm to move 06/19/23 appointment-Toni

## 2023-05-18 ENCOUNTER — Telehealth: Payer: Self-pay | Admitting: Lactation Services

## 2023-05-18 NOTE — Telephone Encounter (Signed)
 Voice mail left yesterday at 1450, pt requesting assistance with breastfeeding questions, unsure if pt was called, I returned call today and left voice mail on pt's phone as there was no answer.  Requested pt call me back today, LC office phone no left

## 2023-06-18 ENCOUNTER — Telehealth: Payer: Self-pay | Admitting: Nurse Practitioner

## 2023-06-18 NOTE — Telephone Encounter (Signed)
 Left vm to confirm 06/25/23 appointment-Toni

## 2023-06-19 ENCOUNTER — Encounter: Payer: 59 | Admitting: Nurse Practitioner

## 2023-06-25 ENCOUNTER — Encounter: Payer: Self-pay | Admitting: Nurse Practitioner

## 2023-06-25 ENCOUNTER — Ambulatory Visit (INDEPENDENT_AMBULATORY_CARE_PROVIDER_SITE_OTHER): Payer: 59 | Admitting: Nurse Practitioner

## 2023-06-25 VITALS — BP 130/68 | HR 82 | Temp 98.1°F | Resp 16 | Ht 66.0 in | Wt 159.2 lb

## 2023-06-25 DIAGNOSIS — Z0001 Encounter for general adult medical examination with abnormal findings: Secondary | ICD-10-CM | POA: Diagnosis not present

## 2023-06-25 DIAGNOSIS — Z8632 Personal history of gestational diabetes: Secondary | ICD-10-CM | POA: Diagnosis not present

## 2023-06-25 DIAGNOSIS — R7303 Prediabetes: Secondary | ICD-10-CM | POA: Diagnosis not present

## 2023-06-25 LAB — POCT GLYCOSYLATED HEMOGLOBIN (HGB A1C): Hemoglobin A1C: 5.7 % — AB (ref 4.0–5.6)

## 2023-06-25 NOTE — Progress Notes (Signed)
 [facility] [address] [place], [place] [address]  Internal MEDICINE  Office Visit Note  Patient Name: Abigail [name]  [id number]  [id number]  Date of Service: 06/25/2023  Chief Complaint  Patient presents with   Annual Exam    HPI Abigail [name] presents for an annual well visit Abigail physical exam.  Well-appearing 38 y.o.female with history of gestational diabetes Routine mammogram: has a diagnostic mammogram ordered by Abigail [name] OBG[facility] due to finding a lump  Pap smear: goes to [facility]: deferred for now except for A1c due to recent history of gestational diabetes.  New or worsening pain: none  Recently gave birth about 6 weeks ago. Had a csection after 2 vaginal births. Doing well. Did have gestational diabetes during pregnancy. Checking A1c today.     Current Medication: No outpatient encounter medications on file as of 06/25/2023.   No facility-administered encounter medications on file as of 06/25/2023.    Surgical History: Past Surgical History:  Procedure Laterality Date   colonoscopy     EXTRACORPOREAL SHOCK WAVE LITHOTRIPSY Right 08/23/2017   Procedure: EXTRACORPOREAL SHOCK WAVE LITHOTRIPSY (ESWL);  Surgeon: Dustin Gimenez, MD;  Location: [facility] ORS;  Service: Urology;  Laterality: Right;    Medical History: Past Medical History:  Diagnosis Date   Abnormal Pap smear of cervix    Fetal demise > 22 weeks, delivered, current hospitalization 07/18/2017   nsvd at 26 weeks    Family History: Family History  Problem Relation Age of Onset   Diabetes Mother    Hypertension Mother    Diabetes Father    Hypertension Father     Social History   Socioeconomic History   Marital status: Married    Spouse name: Not on file   Number of children: Not on file   Years of education: Not on file   Highest education level: Not on file  Occupational History   Not on file  Tobacco Use   Smoking status: Never   Smokeless tobacco: Never  Vaping Use   Vaping status: Never  Used  Substance Abigail Sexual Activity   Alcohol use: No   Drug use: No   Sexual activity: Yes    Birth control/protection: I.U.D.  Other Topics Concern   Not on file  Social History Narrative   Not on file   Social Drivers of Health   Financial Resource Strain: Not on file  Food Insecurity: Not on file  Transportation Needs: Not on file  Physical Activity: Not on file  Stress: Not on file  Social Connections: Not on file  Intimate Partner Violence: Not on file      Review of Systems  Constitutional:  Negative for activity change, appetite change, chills, fatigue, fever Abigail unexpected weight change.  HENT: Negative.  Negative for congestion, ear pain, rhinorrhea, sore throat Abigail trouble swallowing.   Eyes: Negative.   Respiratory: Negative.  Negative for cough, chest tightness, shortness of breath Abigail wheezing.   Cardiovascular: Negative.  Negative for chest pain.  Gastrointestinal: Negative.  Negative for abdominal pain, blood in stool, constipation, diarrhea, nausea Abigail vomiting.  Endocrine: Negative.   Genitourinary: Negative.  Negative for difficulty urinating, dysuria, frequency, hematuria Abigail urgency.  Musculoskeletal: Negative.  Negative for arthralgias, back pain, joint swelling, myalgias Abigail neck pain.  Skin: Negative.  Negative for rash Abigail wound.  Allergic/Immunologic: Negative.  Negative for immunocompromised state.  Neurological: Negative.  Negative for dizziness, seizures, numbness Abigail headaches.  Hematological: Negative.   Psychiatric/Behavioral: Negative.  Negative for behavioral  problems, self-injury Abigail suicidal ideas. The patient [name] not nervous/anxious.     Vital Signs: BP 130/68   Pulse 82   Temp 98.1 F (36.7 C)   Resp 16   Ht 5\' 6"  (1.676 m)   Wt 159 lb 3.2 oz (72.2 kg)   SpO2 99%   BMI 25.70 kg/m    Physical Exam Vitals reviewed.  Constitutional:      General: Abigail [name] awake. Abigail [name] not in acute distress.    Appearance: Normal appearance.  Abigail [name] well-developed, well-groomed Abigail overweight. Abigail [name] not ill-appearing or diaphoretic.  HENT:     Head: Normocephalic Abigail atraumatic.     Right Ear: Tympanic membrane, ear canal Abigail external ear normal.     Left Ear: Tympanic membrane, ear canal Abigail external ear normal.     Nose: Nose normal. No congestion or rhinorrhea.     Mouth/Throat:     Lips: Pink.     Mouth: Mucous membranes are moist.     Pharynx: Oropharynx [name] clear. Uvula midline. No oropharyngeal exudate or posterior oropharyngeal erythema.  Eyes:     General: Lids are normal. Vision grossly intact. Gaze aligned appropriately. No scleral icterus.       Right eye: No discharge.        Left eye: No discharge.     Extraocular Movements: Extraocular movements intact.     Conjunctiva/sclera: Conjunctivae normal.     Pupils: Pupils are equal, round, Abigail reactive to light.     Funduscopic exam:    Right eye: Red reflex present.        Left eye: Red reflex present. Neck:     Thyroid: No thyromegaly.     Vascular: No JVD.     Trachea: Trachea Abigail phonation normal. No tracheal deviation.  Cardiovascular:     Rate Abigail Rhythm: Normal rate Abigail regular rhythm.     Pulses: Normal pulses.     Heart sounds: Normal heart sounds, S1 normal Abigail S2 normal. No murmur heard.    No friction rub. No gallop.  Pulmonary:     Effort: Pulmonary effort [name] normal. No accessory muscle usage or respiratory distress.     Breath sounds: Normal breath sounds Abigail air entry. No stridor. No wheezing or rales.  Chest:     Chest wall: No tenderness.     Comments: Declined breast exam, was recently done at Abigail [name] OBGYN office. Abdominal:     General: Bowel sounds are normal. There [name] no distension.     Palpations: Abdomen [name] soft. There [name] no shifting dullness, fluid wave, mass or pulsatile mass.     Tenderness: There [name] no abdominal tenderness. There [name] no guarding or rebound.  Musculoskeletal:        General: No tenderness or deformity. Normal range of  motion.     Cervical back: Normal range of motion Abigail neck supple.  Lymphadenopathy:     Cervical: No cervical adenopathy.  Skin:    General: Skin [name] warm Abigail dry.     Capillary Refill: Capillary refill takes less than 2 seconds.     Coloration: Skin [name] not pale.     Findings: No erythema or rash.  Neurological:     Mental Status: Abigail [name], Abigail [name], Abigail [name].     Cranial Nerves: No cranial nerve deficit.     Motor: No abnormal muscle tone.     Coordination: Coordination normal.     Gait: Gait normal.  Deep Tendon Reflexes: Reflexes are normal Abigail symmetric.  Psychiatric:        Mood Abigail Affect: Mood Abigail affect normal.        Behavior: Behavior normal. Behavior [name] cooperative.        Thought Content: Thought content normal.        Judgment: Judgment normal.        Assessment/Plan: 1. Encounter for routine adult health examination with abnormal findings (Primary) Age-appropriate preventive screenings Abigail vaccinations discussed, annual physical exam completed. Routine labs for health maintenance deferred for now, recently done by OBGYN. PHM updated.    2. Prediabetes Discussed diet Abigail lifestyle modifications, will repeat A1c in 6 months   3. History of gestational diabetes A1c [name] prediabetic at 5.7 - POCT glycosylated hemoglobin (Hb A1C)      General Counseling: Abigail [name] verbalizes understanding of the findings of todays visit Abigail agrees with plan of treatment. I have discussed any further diagnostic evaluation that may be needed or ordered today. We also reviewed Abigail [name] medications today. Abigail [name] has been encouraged to call the office with any questions or concerns that should arise related to todays visit.    Orders Placed This Encounter  Procedures   POCT glycosylated hemoglobin (Hb A1C)    No orders of the defined types were placed in this encounter.   Return in about 6 months (around 12/25/2023) for F/U, Recheck A1C, Marlisa Caridi PCP prediabetes  .   Total [name] spent:30 Minutes [name] spent includes review of chart, medications, test results, Abigail follow up plan with the patient.   [place] Controlled Substance Database was reviewed by me.  This patient was seen by Laurence Pons, FNP-C in collaboration with Dr. Verneta Gone as a part of collaborative care agreement.  Okie Jansson R. Bobbi Burow, MSN, FNP-C Internal medicine

## 2023-06-30 ENCOUNTER — Encounter: Payer: Self-pay | Admitting: Nurse Practitioner

## 2023-06-30 DIAGNOSIS — Z8632 Personal history of gestational diabetes: Secondary | ICD-10-CM | POA: Insufficient documentation

## 2023-06-30 DIAGNOSIS — R7303 Prediabetes: Secondary | ICD-10-CM | POA: Insufficient documentation

## 2023-12-25 ENCOUNTER — Ambulatory Visit (INDEPENDENT_AMBULATORY_CARE_PROVIDER_SITE_OTHER): Admitting: Nurse Practitioner

## 2023-12-25 ENCOUNTER — Encounter: Payer: Self-pay | Admitting: Nurse Practitioner

## 2023-12-25 VITALS — BP 130/70 | HR 67 | Temp 96.7°F | Resp 16 | Ht 66.0 in | Wt 158.0 lb

## 2023-12-25 DIAGNOSIS — N3001 Acute cystitis with hematuria: Secondary | ICD-10-CM

## 2023-12-25 DIAGNOSIS — R7303 Prediabetes: Secondary | ICD-10-CM | POA: Diagnosis not present

## 2023-12-25 DIAGNOSIS — R102 Pelvic and perineal pain unspecified side: Secondary | ICD-10-CM

## 2023-12-25 LAB — POCT URINALYSIS DIPSTICK
Bilirubin, UA: NEGATIVE
Glucose, UA: NEGATIVE
Nitrite, UA: NEGATIVE
Protein, UA: NEGATIVE
Spec Grav, UA: 1.025 (ref 1.010–1.025)
Urobilinogen, UA: 0.2 U/dL
pH, UA: 5 (ref 5.0–8.0)

## 2023-12-25 LAB — POCT GLYCOSYLATED HEMOGLOBIN (HGB A1C): Hemoglobin A1C: 5.6 % (ref 4.0–5.6)

## 2023-12-25 LAB — POCT URINE PREGNANCY: Preg Test, Ur: NEGATIVE

## 2023-12-25 MED ORDER — CIPROFLOXACIN HCL 500 MG PO TABS: 500.0000 mg | ORAL_TABLET | Freq: Two times a day (BID) | ORAL | 0 refills | Status: AC

## 2023-12-25 NOTE — Progress Notes (Signed)
 [facility] [address] [place], [place] [address]  Internal MEDICINE  Office Visit Note  Patient Name: Abigail [name]  [id number]  [id number]  Date of Service: 12/25/2023  Chief Complaint  Patient presents with   Follow-up    HPI Abigail [name] presents for follow up visit for prediabetes.  Well-appearing 38 y.o. female with no significant medical problems and not current taking any medications. She delivered her baby in February this year via C-section. She did have gestational diabetes during this most recent pregnancy.  Her A1c returned to normal range at 5.6 as of today. Pap smear: sees OBGYN Labs: deferred until next year  New or worsening pain: new pelvic pain/cramping -- not related to C-section scar. Pain radiates to vaginal area and rectum. Urine pregnancy test was negative.  Urinalysis in office was positive for moderate leukocytes, large blood and trace ketones, negative for nitrites.    Current Medication: Outpatient Encounter Medications as of 12/25/2023  Medication Sig   ciprofloxacin  (CIPRO ) 500 MG tablet Take 1 tablet (500 mg total) by mouth 2 (two) times daily for 5 days. Take with food.   No facility-administered encounter medications on file as of 12/25/2023.    Surgical History: Past Surgical History:  Procedure Laterality Date   colonoscopy     EXTRACORPOREAL SHOCK WAVE LITHOTRIPSY Right 08/23/2017   Procedure: EXTRACORPOREAL SHOCK WAVE LITHOTRIPSY (ESWL);  Surgeon: Penne Knee, MD;  Location: [facility] ORS;  Service: Urology;  Laterality: Right;    Medical History: Past Medical History:  Diagnosis Date   Abnormal Pap smear of cervix    Fetal demise > 22 weeks, delivered, current hospitalization 07/18/2017   nsvd at 26 weeks    Family History: Family History  Problem Relation Age of Onset   Diabetes Mother    Hypertension Mother    Diabetes Father    Hypertension Father     Social History   Socioeconomic History   Marital status:  Married    Spouse name: Not on file   Number of children: Not on file   Years of education: Not on file   Highest education level: Not on file  Occupational History   Not on file  Tobacco Use   Smoking status: Never   Smokeless tobacco: Never  Vaping Use   Vaping status: Never Used  Substance and Sexual Activity   Alcohol use: No   Drug use: No   Sexual activity: Yes    Birth control/protection: I.U.D.  Other Topics Concern   Not on file  Social History Narrative   Not on file   Social Drivers of Health   Financial Resource Strain: Not on file  Food Insecurity: Not on file  Transportation Needs: Not on file  Physical Activity: Not on file  Stress: Not on file  Social Connections: Not on file  Intimate Partner Violence: Not on file      Review of Systems  Constitutional:  Positive for fatigue. Negative for chills and unexpected weight change.  HENT:  Negative for congestion, postnasal drip, rhinorrhea, sneezing and sore throat.   Eyes:  Negative for redness.  Respiratory:  Negative for cough, chest tightness and shortness of breath.   Cardiovascular:  Negative for chest pain and palpitations.  Gastrointestinal:  Positive for nausea and rectal pain. Negative for abdominal pain, constipation, diarrhea and vomiting.  Genitourinary:  Positive for dysuria, flank pain, frequency, hematuria, pelvic pain, urgency and vaginal pain.  Musculoskeletal:  Positive for back pain. Negative for arthralgias, joint swelling and  neck pain.  Skin:  Negative for rash.  Neurological: Negative.  Negative for tremors and numbness.  Hematological:  Negative for adenopathy. Does not bruise/bleed easily.  Psychiatric/Behavioral:  Negative for behavioral problems (Depression), sleep disturbance and suicidal ideas. The patient is not nervous/anxious.     Vital Signs: BP 130/70   Pulse 67   Temp (!) 96.7 F (35.9 C)   Resp 16   Ht 5' 6 (1.676 m)   Wt 158 lb (71.7 kg)   SpO2 99%   BMI  25.50 kg/m    Physical Exam Vitals reviewed.  Constitutional:      General: She is not in acute distress.    Appearance: Normal appearance. She is not ill-appearing.  HENT:     Head: Normocephalic and atraumatic.  Eyes:     Pupils: Pupils are equal, round, and reactive to light.  Cardiovascular:     Rate and Rhythm: Normal rate and regular rhythm.  Pulmonary:     Effort: Pulmonary effort is normal. No respiratory distress.  Abdominal:     Tenderness: There is abdominal tenderness in the right lower quadrant, suprapubic area and left lower quadrant. There is no right CVA tenderness, left CVA tenderness, guarding or rebound. Negative signs include Murphy's sign.     Hernia: No hernia is present.  Skin:    General: Skin is warm and dry.     Capillary Refill: Capillary refill takes less than 2 seconds.  Neurological:     Mental Status: She is alert and oriented to person, place, and time.  Psychiatric:        Mood and Affect: Mood normal.        Behavior: Behavior normal.        Assessment/Plan: 1. Prediabetes (Primary) A1c is stable and in normal range, continue to monitor A1c every 6 months  - POCT glycosylated hemoglobin (Hb A1C)  2. Acute cystitis with hematuria Cipro  prescribed, take until gone. Urine sent to lab for culture  - CULTURE, URINE COMPREHENSIVE - ciprofloxacin  (CIPRO ) 500 MG tablet; Take 1 tablet (500 mg total) by mouth 2 (two) times daily for 5 days. Take with food.  Dispense: 10 tablet; Refill: 0  3. Pelvic cramping Urine pregnancy test is negative. Urinalysis is abnormal, sent urine to lab for culture Cipro  prescribed for UTI - POCT urine pregnancy - POCT Urinalysis Dipstick - ciprofloxacin  (CIPRO ) 500 MG tablet; Take 1 tablet (500 mg total) by mouth 2 (two) times daily for 5 days. Take with food.  Dispense: 10 tablet; Refill: 0      General Counseling: Abigail [name] verbalizes understanding of the findings of todays visit and agrees with plan of  treatment. I have discussed any further diagnostic evaluation that may be needed or ordered today. We also reviewed her medications today. she has been encouraged to call the office with any questions or concerns that should arise related to todays visit.    Orders Placed This Encounter  Procedures   CULTURE, URINE COMPREHENSIVE   POCT glycosylated hemoglobin (Hb A1C)   POCT urine pregnancy   POCT Urinalysis Dipstick    Meds ordered this encounter  Medications   ciprofloxacin  (CIPRO ) 500 MG tablet    Sig: Take 1 tablet (500 mg total) by mouth 2 (two) times daily for 5 days. Take with food.    Dispense:  10 tablet    Refill:  0    Fill new script today    Return for previously scheduled, CPE, Blessed Girdner PCP in april 2026,  and otherwise as needed. .   Total time spent:30 Minutes Time spent includes review of chart, medications, test results, and follow up plan with the patient.   [place] Controlled Substance Database was reviewed by me.  This patient was seen by Mardy Maxin, FNP-C in collaboration with Dr. Sigrid Bathe as a part of collaborative care agreement.  Kandis Henry R. Maxin, MSN, FNP-C Internal medicine

## 2023-12-31 LAB — CULTURE, URINE COMPREHENSIVE

## 2024-01-08 ENCOUNTER — Encounter: Payer: Self-pay | Admitting: Nurse Practitioner

## 2024-01-08 DIAGNOSIS — R1030 Lower abdominal pain, unspecified: Secondary | ICD-10-CM

## 2024-01-08 DIAGNOSIS — R102 Pelvic and perineal pain unspecified side: Secondary | ICD-10-CM

## 2024-01-16 ENCOUNTER — Ambulatory Visit
Admission: RE | Admit: 2024-01-16 | Discharge: 2024-01-16 | Disposition: A | Discharge status: Home / Self Care | Source: Ambulatory Visit | Attending: Nurse Practitioner | Admitting: Nurse Practitioner

## 2024-01-16 DIAGNOSIS — R102 Pelvic and perineal pain unspecified side: Secondary | ICD-10-CM

## 2024-01-16 DIAGNOSIS — R1030 Lower abdominal pain, unspecified: Secondary | ICD-10-CM

## 2024-01-22 ENCOUNTER — Ambulatory Visit: Payer: Self-pay | Admitting: Internal Medicine

## 2024-01-23 NOTE — Telephone Encounter (Signed)
Left patient a message to give office a callback.Marland Kitchen

## 2024-01-23 NOTE — Telephone Encounter (Signed)
 Spoke to patient she just wants to monitor them for now.

## 2024-01-23 NOTE — Telephone Encounter (Signed)
-----   Message from [facility] sent at 01/22/2024  4:53 PM EST ----- No acute findings on her CT, does have small multiple kidney stones, if not having severe pain we can monitor  But if she wants we can refer to Urology as well  ----- Message ----- From: Interface, Rad Results In Sent: 01/17/2024   8:45 AM EST To: Mardy Maxin, NP

## 2024-01-26 ENCOUNTER — Encounter: Payer: Self-pay | Admitting: Nurse Practitioner

## 2024-06-25 ENCOUNTER — Encounter: Admitting: Nurse Practitioner
